# Patient Record
Sex: Male | Born: 1970 | Race: Black or African American | Hispanic: No | Marital: Married | Smoking: Never smoker
Health system: Southern US, Community
[De-identification: ages and names within clinical notes are randomized; demographics above are authoritative.]

## PROBLEM LIST (undated history)

## (undated) DIAGNOSIS — I713 Abdominal aortic aneurysm, ruptured, unspecified: Secondary | ICD-10-CM

## (undated) DIAGNOSIS — N186 End stage renal disease: Secondary | ICD-10-CM

## (undated) DIAGNOSIS — I10 Essential (primary) hypertension: Secondary | ICD-10-CM

## (undated) DIAGNOSIS — Z992 Dependence on renal dialysis: Secondary | ICD-10-CM

## (undated) DIAGNOSIS — N289 Disorder of kidney and ureter, unspecified: Secondary | ICD-10-CM

## (undated) HISTORY — PX: AV FISTULA PLACEMENT: SHX1204

## (undated) HISTORY — PX: CARDIAC SURGERY: SHX584

## (undated) HISTORY — PX: DIALYSIS FISTULA CREATION: SHX611

---

## 2005-01-04 ENCOUNTER — Inpatient Hospital Stay (HOSPITAL_COMMUNITY): Admission: EM | Admit: 2005-01-04 | Discharge: 2005-01-11 | Payer: Self-pay | Admitting: Emergency Medicine

## 2005-01-05 ENCOUNTER — Encounter (INDEPENDENT_AMBULATORY_CARE_PROVIDER_SITE_OTHER): Payer: Self-pay | Admitting: *Deleted

## 2005-07-17 HISTORY — PX: CARDIAC SURGERY: SHX584

## 2006-04-21 ENCOUNTER — Inpatient Hospital Stay (HOSPITAL_COMMUNITY): Admission: EM | Admit: 2006-04-21 | Discharge: 2006-05-05 | Payer: Self-pay | Admitting: Emergency Medicine

## 2006-04-22 ENCOUNTER — Encounter: Payer: Self-pay | Admitting: Vascular Surgery

## 2006-04-25 ENCOUNTER — Encounter (INDEPENDENT_AMBULATORY_CARE_PROVIDER_SITE_OTHER): Payer: Self-pay | Admitting: Interventional Cardiology

## 2006-04-27 ENCOUNTER — Encounter (INDEPENDENT_AMBULATORY_CARE_PROVIDER_SITE_OTHER): Payer: Self-pay | Admitting: *Deleted

## 2006-06-05 ENCOUNTER — Encounter (HOSPITAL_COMMUNITY): Admission: RE | Admit: 2006-06-05 | Discharge: 2006-08-16 | Payer: Self-pay | Admitting: Interventional Cardiology

## 2006-07-24 ENCOUNTER — Ambulatory Visit (HOSPITAL_COMMUNITY): Admission: RE | Admit: 2006-07-24 | Discharge: 2006-07-24 | Payer: Self-pay | Admitting: Nephrology

## 2006-09-20 ENCOUNTER — Ambulatory Visit (HOSPITAL_COMMUNITY): Admission: RE | Admit: 2006-09-20 | Discharge: 2006-09-20 | Payer: Self-pay | Admitting: Nephrology

## 2010-01-03 ENCOUNTER — Ambulatory Visit: Payer: Self-pay | Admitting: Surgery

## 2010-01-20 ENCOUNTER — Ambulatory Visit (HOSPITAL_COMMUNITY): Admission: RE | Admit: 2010-01-20 | Discharge: 2010-01-20 | Payer: Self-pay | Admitting: Surgery

## 2010-01-20 ENCOUNTER — Ambulatory Visit: Payer: Self-pay | Admitting: Surgery

## 2010-05-23 ENCOUNTER — Inpatient Hospital Stay (HOSPITAL_COMMUNITY)
Admission: AD | Admit: 2010-05-23 | Discharge: 2010-05-28 | Payer: Self-pay | Source: Home / Self Care | Admitting: Nephrology

## 2010-05-24 ENCOUNTER — Encounter (INDEPENDENT_AMBULATORY_CARE_PROVIDER_SITE_OTHER): Payer: Self-pay | Admitting: Nephrology

## 2010-05-26 ENCOUNTER — Ambulatory Visit: Payer: Self-pay | Admitting: Infectious Disease

## 2010-05-27 ENCOUNTER — Encounter (INDEPENDENT_AMBULATORY_CARE_PROVIDER_SITE_OTHER): Payer: Self-pay | Admitting: Interventional Cardiology

## 2010-07-05 ENCOUNTER — Ambulatory Visit: Payer: Self-pay | Admitting: Vascular Surgery

## 2010-07-05 ENCOUNTER — Ambulatory Visit: Payer: Self-pay | Admitting: Infectious Disease

## 2010-08-05 ENCOUNTER — Ambulatory Visit: Admit: 2010-08-05 | Payer: Self-pay

## 2010-08-07 ENCOUNTER — Encounter: Payer: Self-pay | Admitting: Thoracic Surgery (Cardiothoracic Vascular Surgery)

## 2010-08-07 ENCOUNTER — Encounter: Payer: Self-pay | Admitting: Nephrology

## 2010-08-22 ENCOUNTER — Ambulatory Visit (INDEPENDENT_AMBULATORY_CARE_PROVIDER_SITE_OTHER): Payer: Medicare Other | Admitting: Surgery

## 2010-08-22 DIAGNOSIS — N186 End stage renal disease: Secondary | ICD-10-CM

## 2010-08-22 DIAGNOSIS — T82898A Other specified complication of vascular prosthetic devices, implants and grafts, initial encounter: Secondary | ICD-10-CM

## 2010-08-26 NOTE — Assessment & Plan Note (Unsigned)
OFFICE VISIT  [name], [name] DOB:  04/24/1971                                       08/22/2010 CHART#:[id number]  The patient comes back in today for followup.  I met him in June of this year when I was asked to evaluate an aneurysm on his left arm fistula. He is on dialysis secondary to renal failure following an acute aortic dissection which has been repaired but left him with renal failure.  I studied him, a year ago he had 2 large aneurysms and an ectatic vein with some stenosis around the shoulder.  Since there had not been significant change in the size of his aneurysm and there were no ulcerations we elected to observe this.  He is sent back today by Dr. Darrick Penna for repeat evaluation because it is enlarging.  PHYSICAL EXAM:  Vital signs:  Heart rate is 94, blood pressure 152/91, respiratory rate 20.  General:  He is well appearing, in no distress. Respirations nonlabored.  Cardiovascular:  Large aneurysm with a shiny anterior appearance.  No ulceration.  Excellent thrill.  Skin:  No ulcers.  No rash.  ASSESSMENT:  Enlarging left arteriovenous fistula aneurysm.  PLAN:  Because of the appearance of the aneurysm as well as the fact that it may have gotten slightly larger I feel like at this point we need to have this repaired.  What I have discussed with the patient would be an incision proximal and distal to the aneurysm with an interposition Gore-Tex graft.  This would leave one further proximal aneurysm which is near one of his buttonholes that he access for, however, I think leaving this would be safe, just dealing with the larger one would be the best course of action at this time.  We did discuss my concerns about infection, as well as the residual defect once this aneurysm is decompressed.  We have scheduled this for Thursday, February 16.    Jorge Ny, MD  VWB/MEDQ  D:  08/22/2010  T:  08/23/2010  Job:  [id number]  cc:   Dr  Darrick Penna

## 2010-09-06 ENCOUNTER — Ambulatory Visit (HOSPITAL_COMMUNITY)
Admission: RE | Admit: 2010-09-06 | Discharge: 2010-09-06 | Disposition: A | Discharge status: Home / Self Care | Payer: Medicare Other | Source: Ambulatory Visit | Attending: Surgery | Admitting: Surgery

## 2010-09-06 DIAGNOSIS — T82898A Other specified complication of vascular prosthetic devices, implants and grafts, initial encounter: Secondary | ICD-10-CM | POA: Insufficient documentation

## 2010-09-06 DIAGNOSIS — Z992 Dependence on renal dialysis: Secondary | ICD-10-CM | POA: Insufficient documentation

## 2010-09-06 DIAGNOSIS — N186 End stage renal disease: Secondary | ICD-10-CM

## 2010-09-06 DIAGNOSIS — I120 Hypertensive chronic kidney disease with stage 5 chronic kidney disease or end stage renal disease: Secondary | ICD-10-CM | POA: Insufficient documentation

## 2010-09-06 DIAGNOSIS — Y838 Other surgical procedures as the cause of abnormal reaction of the patient, or of later complication, without mention of misadventure at the time of the procedure: Secondary | ICD-10-CM | POA: Insufficient documentation

## 2010-09-06 LAB — POCT I-STAT 4, (NA,K, GLUC, HGB,HCT)
Glucose, Bld: 106 mg/dL — ABNORMAL HIGH (ref 70–99)
Potassium: 5 mEq/L (ref 3.5–5.1)
Sodium: 140 mEq/L (ref 135–145)

## 2010-09-06 LAB — SURGICAL PCR SCREEN: Staphylococcus aureus: NEGATIVE

## 2010-09-10 NOTE — Op Note (Addendum)
[name], [name]                ACCOUNT NO.:  [id number]  MEDICAL RECORD NO.:  [id number]           PATIENT TYPE:  O  LOCATION:  SDSC                         FACILITY:  [facility]  PHYSICIAN:  Juleen China IV, MDDATE OF BIRTH:  02-08-1971  DATE OF PROCEDURE:  09/06/2010 DATE OF DISCHARGE:  09/06/2010                              OPERATIVE REPORT   PREOPERATIVE DIAGNOSIS:  Left arteriovenous fistula aneurysm.  POSTOPERATIVE DIAGNOSIS:  Left arteriovenous fistula aneurysm.  PROCEDURES PERFORMED: 1. Resection of left arteriovenous fistula aneurysm. 2. Interposition 8-mm Gore-Tex graft.  TYPE OF ANESTHESIA:  General.  SURGEON: 1. Charlena Cross, MD  ASSISTANT:  Della Goo, PA-C  FINDINGS:  Large aneurysm with thrombus present which was sent as a specimen.  An end-to-end anastomosis was performed with 8-mm Gore-Tex graft which was undersized.  I resected the entire aneurysm including the associated skin and performed primary closure of the resected skin area.  INDICATION:  [name] is a 40 year old gentleman who dialyzes through a left upper arm fistula.  He has developed a large approximately 5 cm aneurysm on his fistula.  The skin on top has become very thin.  There is concern for rupture and it has been growing.  He comes in today for a resection.  He had previously been evaluated with a fistulogram which showed no evidence of central stenosis.  PROCEDURE:  The patient was identified in the holding and taken to room 12, he was placed supine on the table.  General anesthesia was administered.  The patient was prepped and draped in the usual fashion. Time-out was called.  Antibiotics were given.  I initially made a transverse incision proximal and distal to the aneurysm.  Through these two incisions, the cephalic vein fistula was circumferentially dissected free and fully mobilized.  Once adequate exposure of the cephalic vein proximal and distal to the aneurysm  was obtained, I created a tunnel which went lateral to the aneurysm with a Kelly clamp.  After tunnel was created, the patient was given systemic heparinization.  I occluded the fistula at the antecubital crease and then transected this portion of the vein.  The defunctionalized limb was oversewn with two layers of 5-0 Prolene.  Prior to closing the aneurysmal side of the vein, I expressed out chronic thrombus which decompressed the aneurysm.  Next, I selected an 8-mm Gore-Tex graft and performed end-to-end anastomosis with running 5-0 Prolene.  Once this was completed, the proximal clamp was released and there was good flow through the graft.  The graft was then brought through the previously created tunnel.  Here, I occluded the fistula proximally and distally.  The defunctionalized side was closed with a 5- 0 Prolene in two layers.  I then performed anastomosis of the graft to the more proximal cephalic vein.  Again, this was an end-to-end anastomosis with running 5-0 Prolene.  Prior to completion of the anastomosis, the appropriate flush maneuvers were performed.  The anastomosis was completed.  There was restoration of good flow through the fistula.  Due to the large redundant skin that was left from this aneurysm, I made an  ellipse around the aneurysm with #10 blade and resected the redundant skin as well as the anterior two-thirds of the aneurysm.  The skin resected was for a length of 6 cm.  There were no branches in this area.  There was no backbleeding.  I then used a 2-0 Vicryl to oversew the residual portion of the aneurysmal sac and the subcutaneous tissue was then closed with interrupted 3-0 Vicryl and the skin was closed with 4-0 Vicryl.  The two transverse incisions that were proximal and distal to the aneurysm were then closed with two layers of 3-0 Vicryl.  Dermabond was placed in the wound.  The patient had a good thrill through his fistula, and he had palpable radial  pulse.  He was then successfully extubated and taken to recovery room in stable condition.     Jorge Ny, MD     VWB/MEDQ  D:  09/06/2010  T:  09/07/2010  Job:  [id number]  Electronically Signed by Arelia Longest IV MD on 09/10/2010 05:31:04 PM

## 2010-09-19 ENCOUNTER — Ambulatory Visit (INDEPENDENT_AMBULATORY_CARE_PROVIDER_SITE_OTHER): Payer: Medicare Other | Admitting: Surgery

## 2010-09-19 DIAGNOSIS — N186 End stage renal disease: Secondary | ICD-10-CM

## 2010-09-20 NOTE — Assessment & Plan Note (Signed)
OFFICE VISIT  Contreras, Billy DOB:  09-17-1970                                       09/19/2010 CHART#:[id number]  The patient comes back in today for follow-up.  He underwent resection of a large aneurysm of his left upper arm fistula, 09/06/2010  after placement of an interposition 8-mm Gore-Tex graft.  He comes back in today.  His incisions are well-healed.  His arm was dramatically better. There is a good thrill up in his fistula.  I think this can be accessed within 2 weeks.  He will contact me if any complications arise.    Jorge Ny, MD Electronically Signed  VWB/MEDQ  D:  09/19/2010  T:  09/20/2010  Job:  [id number]

## 2010-09-27 LAB — IRON AND TIBC
Iron: 15 ug/dL — ABNORMAL LOW (ref 42–135)
Saturation Ratios: 9 % — ABNORMAL LOW (ref 20–55)
TIBC: 171 ug/dL — ABNORMAL LOW (ref 215–435)
UIBC: 156 ug/dL

## 2010-09-27 LAB — CBC
HCT: 28.3 % — ABNORMAL LOW (ref 39.0–52.0)
Hemoglobin: 9.1 g/dL — ABNORMAL LOW (ref 13.0–17.0)
Hemoglobin: 9.7 g/dL — ABNORMAL LOW (ref 13.0–17.0)
MCH: 27.2 pg (ref 26.0–34.0)
MCH: 27.5 pg (ref 26.0–34.0)
MCHC: 34.3 g/dL (ref 30.0–36.0)
MCV: 79.5 fL (ref 78.0–100.0)
Platelets: 513 10*3/uL — ABNORMAL HIGH (ref 150–400)
RBC: 3.31 MIL/uL — ABNORMAL LOW (ref 4.22–5.81)
RBC: 3.56 MIL/uL — ABNORMAL LOW (ref 4.22–5.81)
RBC: 3.84 MIL/uL — ABNORMAL LOW (ref 4.22–5.81)
WBC: 16.8 10*3/uL — ABNORMAL HIGH (ref 4.0–10.5)

## 2010-09-27 LAB — CULTURE, BLOOD (ROUTINE X 2)
Culture  Setup Time: 201111072322
Culture  Setup Time: 201111091915
Culture: NO GROWTH

## 2010-09-27 LAB — COMPREHENSIVE METABOLIC PANEL
Albumin: 3.1 g/dL — ABNORMAL LOW (ref 3.5–5.2)
Alkaline Phosphatase: 55 U/L (ref 39–117)
BUN: 70 mg/dL — ABNORMAL HIGH (ref 6–23)
BUN: 89 mg/dL — ABNORMAL HIGH (ref 6–23)
CO2: 23 mEq/L (ref 19–32)
Calcium: 9.1 mg/dL (ref 8.4–10.5)
Chloride: 97 mEq/L (ref 96–112)
Creatinine, Ser: 17.02 mg/dL — ABNORMAL HIGH (ref 0.4–1.5)
GFR calc Af Amer: 4 mL/min — ABNORMAL LOW (ref >60)
GFR calc non Af Amer: 3 mL/min — ABNORMAL LOW (ref >60)
Glucose, Bld: 105 mg/dL — ABNORMAL HIGH (ref 70–99)
Potassium: 4.6 mEq/L (ref 3.5–5.1)
Sodium: 136 mEq/L (ref 135–145)
Total Bilirubin: 0.4 mg/dL (ref 0.3–1.2)
Total Protein: 8.4 g/dL — ABNORMAL HIGH (ref 6.0–8.3)

## 2010-09-27 LAB — DIFFERENTIAL
Basophils Absolute: 0 10*3/uL (ref 0.0–0.1)
Eosinophils Relative: 0 % (ref 0–5)
Lymphocytes Relative: 7 % — ABNORMAL LOW (ref 12–46)
Monocytes Relative: 9 % (ref 3–12)
Neutrophils Relative %: 84 % — ABNORMAL HIGH (ref 43–77)

## 2010-09-27 LAB — HIV ANTIBODY (ROUTINE TESTING W REFLEX): HIV: NONREACTIVE

## 2010-09-27 LAB — GLUCOSE, CAPILLARY
Glucose-Capillary: 108 mg/dL — ABNORMAL HIGH (ref 70–99)
Glucose-Capillary: 111 mg/dL — ABNORMAL HIGH (ref 70–99)
Glucose-Capillary: 113 mg/dL — ABNORMAL HIGH (ref 70–99)
Glucose-Capillary: 129 mg/dL — ABNORMAL HIGH (ref 70–99)
Glucose-Capillary: 132 mg/dL — ABNORMAL HIGH (ref 70–99)
Glucose-Capillary: 132 mg/dL — ABNORMAL HIGH (ref 70–99)
Glucose-Capillary: 153 mg/dL — ABNORMAL HIGH (ref 70–99)

## 2010-09-27 LAB — RENAL FUNCTION PANEL
Albumin: 3.1 g/dL — ABNORMAL LOW (ref 3.5–5.2)
BUN: 87 mg/dL — ABNORMAL HIGH (ref 6–23)
CO2: 21 mEq/L (ref 19–32)
CO2: 22 mEq/L (ref 19–32)
Chloride: 90 mEq/L — ABNORMAL LOW (ref 96–112)
Chloride: 91 mEq/L — ABNORMAL LOW (ref 96–112)
Creatinine, Ser: 16.13 mg/dL — ABNORMAL HIGH (ref 0.4–1.5)
GFR calc Af Amer: 4 mL/min — ABNORMAL LOW (ref >60)
GFR calc non Af Amer: 3 mL/min — ABNORMAL LOW (ref >60)
Glucose, Bld: 111 mg/dL — ABNORMAL HIGH (ref 70–99)
Sodium: 133 mEq/L — ABNORMAL LOW (ref 135–145)

## 2010-09-27 LAB — FERRITIN: Ferritin: 887 ng/mL — ABNORMAL HIGH (ref 22–322)

## 2010-10-02 LAB — POCT I-STAT, CHEM 8
Calcium, Ion: 1.05 mmol/L — ABNORMAL LOW (ref 1.12–1.32)
HCT: 42 % (ref 39.0–52.0)
Hemoglobin: 14.3 g/dL (ref 13.0–17.0)
Sodium: 134 mEq/L — ABNORMAL LOW (ref 135–145)
TCO2: 30 mmol/L (ref 0–100)

## 2010-11-29 NOTE — Assessment & Plan Note (Signed)
OFFICE VISIT   [name], [name]  DOB:  [birth year]                                       01/03/2010  CHART#:[id number]   REASON FOR VISIT:  Aneurysm, left arm dialysis graft.   HISTORY:  This is a 40 year old gentleman with end-stage renal disease.  He started dialysis in 2007 after renal failure following aortic  dissection.  He underwent sternotomy and aortic dissection repair but  had malperfusion to his kidneys requiring dialysis.  He dialyzes at  home.  He had developed an aneurysm on his left arm fistula  approximately 2 years ago.  It has not gotten any bigger over the past  several years. He has not had any ulcers on it.  He does have a history  of having undergone balloon venoplasty 6 months after his fistula was  placed.   The patient also suffers from hypertension which is managed medically.   REVIEW OF SYSTEMS:  Negative for chest pain, negative for shortness of  breath.  All other review of systems are negative.   PAST MEDICAL HISTORY:  Hypertension, end-stage renal disease, aortic  dissection.   PAST SURGICAL HISTORY:  Ascending aortic dissection repair and left  upper arm fistula.   SOCIAL HISTORY:  He is married with 3 children.  He works at [employer].  He does not smoke and does not drink.   FAMILY HISTORY:  Negative for cardiovascular disease.   ALLERGIES:  None.   MEDICATIONS:  PhosLo, Hectorol, aspirin, Epogen, Dialyvite.   PHYSICAL EXAMINATION:  Heart rate 102, blood pressure 159/107,  temperature is 98.0.  General:  Well-appearing, no distress.  HEENT:  Within normal limits.  Lungs:  Clear bilaterally.  Cardiovascular:  Regular rate and rhythm.  No carotid bruits.  Abdomen:  Soft, nontender.  No pulsatile mass.  Musculoskeletal:  Without major deformity.  Neuro:  He has no focal deficits.  Skin:  Without rash.  Left arm:  The patient  has aneurysmal changes to his fistula near the mid to distal upper arm.  There is no skin breakdown.   PLAN:  Left arm fistula aneurysm.  I think the best course of action is  to proceed with a fistulogram to see if there is any evidence of venous  stenosis.  The patient does have prominent collateral veins in his  forearm, and he most likely has some form of central stenosis.  I would  also like to get a fistulogram to define his anatomy.  If this does come  to resection, I would like to know where I can plan my proximal and  distal anastomosis so as to preserve his button hole sites.  I have  scheduled his fistulogram for Thursday, July 7th.  We will proceed with  venoplasty if need be.     Jorge Ny, MD  Electronically Signed   VWB/MEDQ  D:  01/03/2010  T:  01/04/2010  Job:  [id number]

## 2010-12-02 NOTE — Consult Note (Signed)
NAMEYORDIN, RHODA                ACCOUNT NO.:  [id number]   MEDICAL RECORD NO.:  [id number]          PATIENT TYPE:  INP   LOCATION:  6731                         FACILITY:  [facility]   PHYSICIAN:  Di Kindle. Edilia Bo, M.D.DATE OF BIRTH:  12-05-1970   DATE OF CONSULTATION:  04/22/2006  DATE OF DISCHARGE:                                   CONSULTATION   REASON FOR CONSULTATION:  Need for hemodialysis access.   HISTORY:  This is a pleasant 40 year old gentleman with chronic renal  insufficiency secondary to hypertension.  He is right-handed.  He was  admitted on 04/21/2006 and vascular surgery was consulted for placement of  temporary dialysis catheter and also to evaluate him for long-term access.   PAST MEDICAL HISTORY:  Otherwise significant for hypertension and a history  of hypokalemia in the past.  He has no history of diabetes, history of  previous MI or history of congestive heart failure.   REVIEW OF SYSTEMS:  He has had no recent chest pain, chest pressure,  palpitations or arrhythmias.  He has had no bronchitis, asthma or wheezing.   PHYSICAL EXAMINATION:  Temperature is 97.7, blood pressure 198/124.  Currently pressure is down to 181/102.  Lungs are clear bilaterally to auscultation.  He has a palpable brachial and  radial pulse bilaterally.  I did not see a usable forearm cephalic vein on  either side.   RECOMMENDATIONS:  I have recommend that we try to obtain a vein map of the  left arm and then schedule him for a place of a Diatek catheter and left AV  fistula or AV graft tomorrow by Dr. Madilyn Fireman.  I have discussed the indications  for the procedures and potential complications including not limited to  bleeding, pneumothorax, catheter infection, with respect to the fistula  graft.  He understands the risks or failure of the fistula to mature, graft  thrombosis, graft infection, steal syndrome.  All of his questions were  answered and he is agreeable to  proceed.      Di Kindle. Edilia Bo, M.D.  Electronically Signed     CSD/MEDQ  D:  04/22/2006  T:  04/23/2006  Job:  [id number]

## 2010-12-02 NOTE — Consult Note (Signed)
NAMEEMERIC, NOVINGER                ACCOUNT NO.:  [id number]   MEDICAL RECORD NO.:  [id number]          PATIENT TYPE:  INP   LOCATION:  0103                         FACILITY:  [facility]   PHYSICIAN:  Wilber Bihari. Caryn Section, M.D.   DATE OF BIRTH:  1971-03-23   DATE OF CONSULTATION:  01/04/2005  DATE OF DISCHARGE:                                   CONSULTATION   Mr. Copelan is a 40 year old previously healthy black man admitted with  blurred vision, marked elevation in blood pressure, BUN 31, creatinine 4.3  and potassium 2.3.  Dr. Jomarie Longs requested renal consultation.  Mr. Rodriquez was  told of borderline hypertension about five years ago but had exam two  years ago, 130/80.  No blood pressure has been checked since that time.  He went to the eye doctor today for blurred vision and headache, and he was  found to have papilledema, marked hypertensive retinopathy.  Blood pressure  was taken, and he was told to come to the ER.   PAST MEDICAL HISTORY:  No surgery.  He does not have a family doctor.  No  prior hospitalizations.   REVIEW OF SYSTEMS:  He has noted foamy urine 2-3 months.  No rash.  No  arthritis.  No gout.  No alopecia.  No oral ulcers.  No hesitancy.  No  urgency.  No decreased force of his urinary stream.  No renal colic.  No  gross hematuria.  No hemoptysis.  No angina.  No claudication.  No loss of  consciousness.  No palpitations.   SOCIAL HISTORY:  He lives with his wife.  They have been married x4 months.  He was born in [place].  He graduated from [employer] in [place] with a degree in Consulting civil engineer.  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  He does  not smoke cigarettes, drink alcohol, or take illegal drugs.   FAMILY HISTORY:  Father is age 42.  Mother is age 92.  He has one older  brother and one younger sister.  All are well.  No renal disease in the  family.  No one in the family on dialysis.  He has  three children by a  previous marriage, 70, 53, and 42 years old.  The 42 year old lives with him.  The 45-year-old and 70-year-old live with their mother.   PHYSICAL EXAMINATION:  VITAL SIGNS:  Blood pressure now 200/120, pulse 84,  respirations 20, blood pressure 236/156 earlier.  GENERAL:  He is an awake, alert, and courteous man.  HEENT:  Eyes show papilledema, hemorrhages, and exudates.  Nose, mouth, and  pharynx reveal moist mucous membranes.  No oral ulcers.  No abscesses.  No  inflammation.  NECK:  Carotids 2+ bilaterally without bruits.  LUNGS:  Clear.  HEART:  Regular rate and rhythm.  No rubs heard.  ABDOMEN:  Nontender.  No organs or masses are felt.  Bowel sounds are  present.  No bruits are heard.  GU:  Circumcised penis.  Testes are descended bilaterally without masses.  EXTREMITIES:  No rash.  No arthritis.  No atheroembolic changes.  NEUROLOGIC:  Right-handed.  Strength equal.  Sensation is intact.   Urinalysis shows 3+ protein, 3+ blood, 1-2 RBCs, no WBCs, no casts.   Renal ultrasound shows right kidney 10.7 cm, left kidney 10.9 cm, slight  increase in echogenicity.  No hydronephrosis.   Hemoglobin 13.8, WBC 6,100, PLTS 157K, sodium 139, potassium 2.3, chloride  93, CO2 33, BUN 31, creatinine 4.3, calcium 9.5, albumin 4.5.   IMPRESSION:  1.  Hypertension with hypokalemia.  2.  Abnormal renal function with 3+ proteinuria and bland urine sediment.   PLAN:  1.  Continue with slow decrease in blood pressure over the next 48 hours.  I      do not think he needs evaluation for primary hyperaldosteronuria unless      potassium is very hard to replace (potassium quite likely depressed from      hyperuremic state of hypertensive crisis.  Would not use ACE inhibitor      currently.  2.  Check HIV.  Check PT and Ivy's bleeding time, in case renal biopsy is      needed.  No IV's and no needle sticks in left forearm (save for vascular      access).  Will follow.        RFF/MEDQ  D:  01/04/2005  T:  01/04/2005  Job:  [id number]

## 2010-12-02 NOTE — Op Note (Signed)
[name], [name]                ACCOUNT NO.:  [id number]   MEDICAL RECORD NO.:  [id number]          PATIENT TYPE:  INP   LOCATION:  6731                         FACILITY:  [facility]   PHYSICIAN:  Balinda Quails, M.D.    DATE OF BIRTH:  10/23/1970   DATE OF PROCEDURE:  04/23/2006  DATE OF DISCHARGE:                                 OPERATIVE REPORT   SURGEON:  Denman George, MD.   ASSISTANT:  Charlesetta Garibaldi, PA   ANESTHESIA:  Local with MAC.   ANESTHESIOLOGIST:  Smith.   PREOPERATIVE DIAGNOSIS:  End-stage renal failure.   POSTOPERATIVE DIAGNOSIS:  End-stage renal failure.   PROCEDURE:  1. Left brachiocephalic arteriovenous fistula.  2. Ultrasound-guided right internal jugular Diatek catheter.   OPERATIVE PROCEDURE:  The patient brought to the operating room stable  hemodynamic condition.  Placed in supine position.  Left arm prepped and  draped in sterile fashion.   Skin, subcutaneous tissues instilled 1% Xylocaine left wrist.  Longitudinal  skin incision made through the anatomical snuff box.  Dissection carried  down to expose the very small cephalic vein.  This was less than 2 mm in  size.  Felt to be an inadequate for Cimino arteriovenous fistula.  The  subcutaneous tissue was closed running 3-0 Vicryl sutures.  Skin closed 4-0  Monocryl.   The second skin incision then made left antecubital fossa.  Dissection  carried through the subcutaneous tissue electrocautery.  The left cephalic  vein identified.  This was 2.5 to 3 mm in size.  Was mobilized.  Tributaries  ligated 4-0 silk and divided.  The vein ligated distally with clips.  Divided and dilated up easily to 2.5 mm.  Heparin saline flushed and  controlled with a bulldog clamp.  The patient administered 3000 units  heparin intravenously.   The brachial artery then was freed.  Encircled proximally distally with  vessel loops.  Controlled with bulldog clamps.  Longitudinal arteriotomy  made.  The vein anastomosed  end-to-side to the brachial artery using running  7-0 Prolene suture.  Clamps were removed.  Excellent flow present.  Adequate  hemostasis obtained.  Sponge instrument counts correct.   The subcutaneous tissue closed running 3-0 Vicryl suture.  Skin closed with  4-0 Monocryl.  Steri-Strips applied.  Sterile dressings applied to the arm.   Ultrasound of the right neck then carried out.  The right internal jugular  vein appeared to be widely patent with normal compressibility.  The right  neck and chest prepped and draped in sterile fashion.   Skin and subcutaneous tissues instilled 1% Xylocaine.  A needle easily  introduced right internal jugular vein.  0.035 J-wire passed through the  needle into the superior vena cava under fluoroscopy.  The site opened 11  blade.  The 12, 14, and 16 dilators advanced over guidewire.  16 dilator  tearaway sheath advanced over the guidewire.  The dilator and guidewire  removed.  Catheter placed through the sheath into the superior vena cava.  The tearaway sheath removed.  Subcutaneous tunnel created, catheter brought  through the tunnel.  Catheter divided and hub mechanism assembled.   Insertion site closed with interrupted 3-0 nylon suture.  Catheter fixed  skin interrupted 2-0 silk suture.  Sterile dressings applied.  The patient  transferred to recovery in stable condition.      Balinda Quails, M.D.  Electronically Signed     PGH/MEDQ  D:  04/23/2006  T:  04/24/2006  Job:  [id number]

## 2010-12-02 NOTE — Consult Note (Signed)
NAMEAVIEN, [name]                ACCOUNT NO.:  [id number]   MEDICAL RECORD NO.:  [id number]          PATIENT TYPE:  INP   LOCATION:  2305                         FACILITY:  [facility]   PHYSICIAN:  Salvatore Decent. Cornelius Moras, M.D. DATE OF BIRTH:  1971-03-20   DATE OF CONSULTATION:  04/25/2006  DATE OF DISCHARGE:                                   CONSULTATION   REQUESTING PHYSICIAN:  Dr. Verdis Prime.   PRIMARY CARE PHYSICIAN:  Dr. Delano Metz.   REASON FOR CONSULTATION:  Aortic dissection.   HISTORY OF PRESENT ILLNESS:  [name] is a 40 year old African American  male with history of longstanding hypertension and chronic renal failure.  He was hospitalized in June2006 with hypertensive crisis and retinal  hemorrhages.  At that time his creatinine was 4.0.  MRA of the aorta  performed at that time was unremarkable.  Over the last year.  The patient  has not kept follow-up appointments with the [facility].  He  recently developed shortness of breath, lower extremity edema, fatigue and  orthopnea, ultimately prompting him to present to Urgent Care at [facility] on [place].  He was diagnosed with congestive heart failure,  hypertension and renal failure.  His creatinine was 16.8 with a BUN of 163.  He returned on October6 at which time his creatinine was up further to 19.8  with a BUN of 211.  He was subsequently hospitalized and started on  hemodialysis.  He underwent placement of a right IJ dialysis catheter and  left forearm primary AV fistula on October8 by Dr. Madilyn Fireman.  This procedure  was uncomplicated.  Cardiology consultation was requested due to the  development of paroxysmal atrial flutter and malignant hypertension.  The  patient underwent a 2-D echocardiogram this afternoon which revealed severe  left ventricular hypertrophy with mild left ventricular systolic  dysfunction, ejection fraction estimated at 50%.  There was mild to moderate  aortic insufficiency with  mild dilatation of the aortic root and the  ascending thoracic aorta.  An aortic dissection involving the descending  thoracic aorta was noted and this prompted cardiothoracic surgery  consultation.  A CT angiogram of the aorta was ordered and has been obtained  this evening.  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  By definition this now represents type A aortic  dissection due to retrograde involvement of the ascending aorta from what  started in the descending aorta.  There is a tiny amount of pericardial  fluid that is of serous fluid density on CT scan and not consistent with  hematoma.  There is a small left pleural effusion that also appears to be  serous and not consistent with hematoma.  The left kidney is perfused off  the false lumen where the right kidney and all  the visceral vessels are  perfused off the true lumen of the descending  thoracic aorta.   REVIEW OF SYSTEMS:  The patient specifically denies any symptoms of chest  pain, back pain, neck pain, jaw pain at all, either presently or in recent  history.  The patient has had some exertional shortness of breath, but  reports really having more symptoms related to fatigue.  The patient states  he had some lower extremity edema.  At presentation, but this has improved.  He has been eating well and his appetite has been stable.  He denies  abdominal pain.  He denies back pain or leg pain.  He denies headaches or  visual disturbances.  He denies any transient neurologic symptoms.  The  remainder of his review of systems is unremarkable.   PAST MEDICAL HISTORY:  1. Malignant hypertension.  2. Renal failure.   PAST SURGICAL HISTORY:  None.   MEDICATIONS PRIOR  TO ADMISSION:  Labetalol and Lasix.   CURRENT MEDICATIONS:  Amlodipine, metoprolol, Aranesp, iron, and clonidine  as necessary for severe hypertension.   DRUG ALLERGIES:  None known.   PHYSICAL EXAM:  GENERAL: The patient is a well-appearing African American  male who appears his stated age and in no acute distress.  VITAL SIGNS:  Blood pressure is currently approximately 170/100.  He is in  sinus rhythm.  HEENT:  Exam is grossly unremarkable.  Neck is supple.  There are no carotid  bruits.  There is no jugular venous distension.  Auscultation of the chest  demonstrates clear breath sounds bilaterally.  No wheezes or rhonchi noted.  CARDIOVASCULAR:  Exam notable for regular rate and rhythm.  No murmurs, rubs  or gallops are noted.  ABDOMEN: Flat, soft, nontender.  There are no palpable masses.  Bowel sounds  present.  Pulses are palpable in both groins.  EXTREMITIES:  Small surgical incision of the left wrist for recent primary  AV fistula.  There is no surrounding hematoma.  There is no lower extremity  edema.  The remainder of his  physical exam is noncontributory.   DIAGNOSTIC TEST:  2-D echocardiogram and CT angiogram of the thoracic aorta  performed today have both been reviewed.  Findings are as discussed  previously.   IMPRESSION:  Probable original type B aortic dissection with retrograde  extension of intramural hematoma to involve the ascending aorta and  transverse aortic arch, which by definition means this now represents a type  A aortic dissection.  I suspect this happened quite some time in the past  and has stabilized, although it is impossible to know for certain how long  this has been there and MRA performed in 2006 clearly did not demonstrate  aortic dissection at that time.  Despite the fact that this may have  stabilized, I do not feel that we can presume the ascending aorta and transverse arch will remain stable and I suspect the patient should best be   treated as though this were an acute or subacute type A dissection.  Given  his stability, it would probably make sense to proceed with coronary  arteriography if the patient will consider surgical replacement of his  ascending aorta and transverse aortic arch.  At the very least he requires  aggressive attention to control his blood pressure.   PLAN:  I have discussed issues at length with [name] this evening.  He is  quite surprised by my comments and recommendations, and at this point he is  not willing to consider surgical intervention.  For that matter the patient  does  not wish to be transferred to the intensive care unit for monitoring  purposes at this juncture.  He wants some time to think things over and to  talk it over with his family.  Under the circumstances, this seems  reasonable, although again I remain concerned regarding the small but  probably real potential for acute decompensation and/or myocardial  infarction and/or stroke.  All of his questions have been addressed.  I have  discussed these findings over  the telephone with Dr. Briant Cedar from the nephrology service and we will  attempt to contact Dr. Katrinka Blazing from the cardiology service.  I have offered to  return early tomorrow morning to discuss matters further with [name] and  I have suggested that he needs to contact us family members and have them  present.      Salvatore Decent. Cornelius Moras, M.D.  Electronically Signed     CHO/MEDQ  D:  04/25/2006  T:  04/27/2006  Job:  [id number]   cc:   Lyn Records, M.D.  Maree Krabbe, M.D.

## 2010-12-02 NOTE — H&P (Signed)
[name], [name]                ACCOUNT NO.:  [id number]   MEDICAL RECORD NO.:  [id number]          PATIENT TYPE:  INP   LOCATION:  1825                         FACILITY:  [facility]   PHYSICIAN:  Maree Krabbe, M.D.DATE OF BIRTH:  October 09, 1970   DATE OF ADMISSION:  04/21/2006  DATE OF DISCHARGE:                                HISTORY & PHYSICAL   HISTORY:  The patient is a 40 year old African American male with a history  of hypertension and chronic kidney disease.  He was last seen here at [facility] in June 2006 when he presented with retinal hemorrhages and a  blood pressure of 300/150.  Blood pressure was controlled and he was  discharged.  His creatinine was 4.0 at the time when he had a renal workup  with echogenic kidneys normal size; no hydronephrosis.  Negative ANA.  Negative HIV.  Normal complement levels and normal serum catecholamines.  He  saw Dr. Caryn Section twice since then and the last time was in January 2007 at  [facility] but he has not kept any regular appointments with  any doctors since over the past year.  He sees Urgent Care as needed.  He  recently developed shortness of breath, lower extremity edema, and fatigue,  and orthopnea.  He went to Urgent Care at [facility] on April 19, 2006,  two days ago.  He was diagnosed with CHF, hypertension, renal failure.  He  was taking labetalol twice daily at the time and additionally was started on  Lasix, lisinopril, and potassium pills.  When his labs came back, a BUN was  163, creatinine 16.8, and the lisinopril was held and he was told to come  back for repeat labs today.  Today, the BUN came back at 211, creatinine  19.8.  He was told to go immediately to the emergency room for dialysis.  The patient did have some nausea and vomiting.  He was put on Lasix 40  b.i.d. and his shortness of breath has resolved.  His leg edema has not.  He  denies excessive use of over-the-counter NSAIDs.  He has had  significant  fatigue with exertion.  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  He has been married, I  believe it is his second marriage, time for 1 year.  He has 3 children, a 54-  year-old, 93-year-old, and 42 year old at home.  He does not smoke, do drugs,  or drink alcohol.  He does not have a regular physician.   PAST MEDICAL HISTORY:  1. Hypertension, malignant hypertension.  2. Renal failure.   PAST SURGICAL HISTORY:  Noncontributory.   MEDICATIONS:  1. Labetalol, uncertain dose b.i.d. which he has been taking for some      time.  2. Lasix 40 b.i.d. was added this week.   SOCIAL HISTORY:  As above.   REVIEW OF SYSTEMS:  GENERAL:  No fever, chills, sweats, or weight loss.  ENT:  No hearing loss, visual change, sore throat or difficulty swallowing.  CARDIORESPIRATORY:  As  above.  No hemoptysis.  No chest pain.  GI:  No  nausea, vomiting, or abdominal pain recently.  GU:  No difficulty voiding.  No dysuria, hematuria, or change in voiding habits.  MUSCULOSKELETAL:  No  myalgia or arthralgia.  Otherwise as above.  NEUROLOGIC:  No focal numbness  or weakness.  No history of stroke, TIA, or seizure.  He did have  papilledema with his admission for malignant hypertension.   PHYSICAL EXAMINATION:  VITAL SIGNS:  Blood pressure currently 170/100.  Heart rate 80.  Respirations 18.  Temperature 98.6.  GENERAL:  The patient is alert and oriented, comfortable, lying at 40  degrees in no distress, pleasant.  SKIN:  Without rash.  HEENT:  PERRL, EOMI.  Throat was clear.  NECK:  Supple without JVD.  CHEST:  Clear throughout.  No rales or wheezing.  CARDIAC:  Regular rate and rhythm without murmur, rub, or gallop.  ABDOMEN:  Soft, nontender.  The liver edge is 4 cm below the right costal  margin; nontender, nonpulsatile.  No bruits.  GU:  Deferred.  MUSCULOSKELETAL:  He has 3+ pitting edema from the knees to the feet   bilaterally. No joint effusion or deformity.  NEUROLOGIC:  Grossly nonfocal  motor exam.   LABS FROM URGENT CARE EARLIER THIS MORNING:  Sodium 130, potassium 3.6, BUN  211, creatinine 19.8, CO2 16, hemoglobin 11, white blood count 8000,  platelets 189, calcium 8, albumin 2.7.   IMPRESSION:  1. 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  Workup in June 2006 was essentially      negative.  Creatinine was 4 sixteen months ago and now is 19.  He has      had poor compliance and followup with doctors; I presume this is      primarily a financial issue but we did not go into this.  2. Hypertension.  3. A history of malignant hypertension June 2006.  4. Hypokalemia.  5. Volume excess with lower extremity edema.   PLAN:  Admit.  Lasix.  Start dialysis in the morning with a temporary  catheter.  Blood pressure control.  Check PTH, iron saturation, serum  phosphorus.  Consult CVTS for Diatek catheter and permanent access placement  earlier this week.      Maree Krabbe, M.D.  Electronically Signed     RDS/MEDQ  D:  04/21/2006  T:  04/23/2006  Job:  [id number]

## 2010-12-02 NOTE — Op Note (Signed)
[name], [name]                ACCOUNT NO.:  [id number]   MEDICAL RECORD NO.:  [id number]          PATIENT TYPE:  INP   LOCATION:  2305                         FACILITY:  [facility]   PHYSICIAN:  Salvatore Decent. Cornelius Moras, M.D. DATE OF BIRTH:  Oct 22, 1970   DATE OF PROCEDURE:  04/27/2006  DATE OF DISCHARGE:                                 OPERATIVE REPORT   PREOPERATIVE DIAGNOSIS:  Type A aortic dissection.   POSTOPERATIVE DIAGNOSIS:  Type A aortic dissection.   PROCEDURE:  Median sternotomy for replacement of the ascending thoracic  aorta and transverse aortic arch with elephant trunk distal anastomoses and  resuspension of the native aortic valve using profound hypothermia and total  circulatory arrest.   SURGEON:  Salvatore Decent. Cornelius Moras, M.D.   ASSISTANT:  Dr. Charlett Lango   ANESTHESIA:  General   CLINICAL NOTE:  The patient is a 40 year old African American male with  longstanding hypertension and end-stage renal disease who is now dialysis  dependent.  The patient presents with symptoms of renal failure and was  hospitalized and begun on hemodialysis.  Echocardiogram was performed  demonstrating severe left ventricular hypertrophy with mild dilatation of  the aortic root and what appeared to be an aortic dissection involving the  descending thoracic aorta.  CT angiogram of the thoracic aorta demonstrates  what appears to have developed initially as a type B aortic dissection with  retrograde progression to involve the aortic arch and the ascending aorta  with large intramural hematoma extending down into the aortic root.  There  is mild to moderate aortic insufficiency.  Cardiac CT scan was also  performed to define the coronary anatomy and the patient does not appear to  have any high-grade significant proximal coronary artery stenosis.   OPERATIVE CONSENT:  The patient has been counseled at length regarding the  indications and potential benefits of surgical intervention.   Alternative  treatment strategies have been discussed including possible conservative  treatment with aggressive blood pressure control and observation versus  proceeding with surgical repair at this juncture.  Due to the involvement of  the ascending aorta and transverse aortic arch with involvement of the  aortic valve and aortic insufficiency, the patient's condition is felt not  likely to remain stable despite the fact that the patient appears to have  presented with a subclinical event related to the aortic dissection and the  time course remains uncertain.  The patient and his family understand  increased risks associated with surgery and specifically accept all  associated risks including but not limited to risk of death, stroke,  myocardial infarction, congestive heart failure, respiratory failure,  pneumonia, bleeding requiring blood transfusion, arrhythmia, heart block  with bradycardia requiring permanent pacemaker, or late complications  related to progression of the aortic pathology involving the remaining  descending thoracic aorta.  They understand the possible need for aortic  valve replacement if resuspension of the native aortic valve is not  feasible.  They understand that the surgery itself will likely require at  least a brief period of time with profound hypothermia and deep and total  circulatory arrest due to likely need to replace the entire transverse  aortic arch.  All their questions have been addressed.   OPERATIVE FINDINGS:  1. Subacute aortic dissection with what clearly developed initially as a      type B aortic dissection and developed a retrograde progression to      involve the transverse aortic arch and the ascending aorta with a large      amount of intramural hematoma but without reentry intimal tear into the      true lumen of the ascending aorta.  2. Subacute pericarditis.  3. Severe hypertensive cardiomyopathy with severe left ventricular       hypertrophy.  4. No residual aortic insufficiency after successful surgical repair and      resuspension of the native aortic valve.   OPERATIVE NOTE:  The patient is brought to the operating room on the above-  mentioned date and placed in the supine position on the operating table.  Central monitoring was established by the anesthesia service under the care  and direction of Dr. Kipp Brood.  Specifically, a Swan-Ganz catheter is  placed through the left internal jugular approach.  A right radial arterial  line was placed.  Intravenous antibiotics were administered.  Following  induction with general endotracheal anesthesia, Foley catheter was placed.  The patient's chest, abdomen, both groins, and both lower extremities were  prepared, draped in sterile manner.   Baseline transesophageal echocardiogram was performed by Dr. Noreene Larsson.  This  demonstrates severe left ventricular hypertrophy.  The aortic dissection is  easily visualized with a large intramural hematoma in the wall of the  ascending thoracic aorta extending down into the aortic root to involve the  sinus of Valsalva adjacent to the noncoronary cusp and right coronary cusp  of the aortic valve.  There is mild aortic insufficiency.   A small right subclavian incision is made one fingerbreadth below the  clavicle across the right deltopectoral groove.  The pectoralis major muscle  fibers were divided longitudinally to expose the fascia in the deltopectoral  groove which is incised with electrocautery.  The right axillary artery was  now exposed using a self-retaining retractor.  The right axillary artery is  normal in appearance.   A median sternotomy incision was performed.  The pericardium is opened.  The  ascending aorta is acutely inflamed with what appears to be subacute  inflammation.  There is hemorrhagic pericardial effusion.  There is severe left ventricular hypertrophy with severe hypertensive cardiomyopathy with   very abnormal appearing gross appearance of the myocardium itself.  There is  visible atherosclerotic plaque within the coronary arteries.  The patient is  heparinized systemically.   A right femoral arterial line is placed using the Seldinger technique for  monitoring purposes.  The right axillary artery is now briefly occluded  between the DeBakey vascular clamps and an 8 mm Hemashield graft was sewn in  end-to-side fashion onto the axillary artery to serve as the arterial  cannula for cardiopulmonary bypass.  The Hemashield graft is now connected  directly to the arterial line from the cardiopulmonary bypass machine.  A  two-stage venous cannula is placed through the right atrium.  A retrograde  cardioplegic catheter was placed through the right atrium into the coronary  sinus.  Adequate heparinization is verified.  Cardiopulmonary bypass is  begun.  The left ventricular vent was placed through the right superior  pulmonary vein.  Systemic cooling is begun to cool the patient to  core  temperature less than 18 degrees centigrade.  Further dissection is  continued around the ascending aorta, the innominate vein, the innominate  artery, and the left carotid artery.  The aortic crossclamp was placed  across the ascending aorta and the heart was arrested using cold retrograde  blood cardioplegia.  Iced saline slush was applied for topical hypothermia.  The initial cardioplegic arrest was felt to be excellent.  Repeat doses of  retrograde cardioplegia administered intermittently throughout the  crossclamp portion of the operation to maintain left ventricular septal  temperature below 15 degrees centigrade.   The ascending aorta is transected at its midsection just below the aortic  crossclamp.  There is a large amount of jelly-like clot within the false  lumen of the ascending aorta.  There is a great deal of inflammation around  the exterior surface of the aortic wall itself, suggesting  subacute  presentation of this aortic dissection.  The aorta is trimmed back to the  level of the sinotubular junction.  The dissection extends down below the  sinotubular junction overlying the noncoronary cusp and the right coronary  cusp of the aortic valve.  The clot was evacuated from this region and the  valve was resuspended using 4-0 Prolene pledgeted sutures to resuspend each  of the three commissures.  The layers of the wall of the aortic root are now  reapproximated with BioGlue.  The 26 mm straight Hemashield graft is sewn  end-to-end to the aortic root at the level of the sinotubular junction using  interrupted 2-0 Ethibond horizontal mattress pledgeted sutures  circumferentially.  The proximal anastomoses is reinforced with BioGlue.  This proximal aortic graft was now tucked out of the way to facilitate  exposure.   The patient is placed in steep Trendelenburg position.  High-dose sodium Pentothal anesthesia is administered and the patient is given 1 gram of Solu-  Medrol intravenously.  The patient's head is packed in ice after verifying  core body temperature and a temperature of the hypopharynx less than 18  degrees centigrade.  Total circulatory arrest is begun and aortic crossclamp  was removed.  Dissection is continued to define the anatomy of the aortic  arch.  The hematoma extends throughout the entire aortic arch past the  origin of the left subclavian artery.  The origins of the innominate artery,  the left carotid artery, and the left subclavian artery are VT amputated  from the aortic arch and trimmed to facilitate individual anastomoses.  A  vascular clamp placed across the innominate artery at this juncture and  partial antegrade cerebral perfusion is reinstituted via the right axillary  artery after a total circulatory arrest time of 8 minutes.   The Hemashield Gold 14 x 10 x 10 mm branched vascular graft is chosen to  reconstruct the anastomoses for the  great vessels of the arms and head.  After trimming each individual leg of the graft to an appropriate length,  the left subclavian artery is initially anastomosed in an end-to-end fashion  to one of the 10 mm branches of the vascular graft.  The left carotid artery  is then anastomosed to a second 10 mm branch in an end-to-end fashion.  Finally, the innominate artery is sewn in an end-to-end fashion to the 14 mm  branch of this trifurcated graft.  A clamp was now placed across the graft  itself and antegrade cerebral perfusion and perfusion to both upper  extremities are reconstituted after 69 minutes of partial  antegrade  perfusion.   A 26 mm straight Hemashield vascular graft is invaginated on itself to  construct an elephant trunk distal anastomoses.  Initially a 28-mm graft is  chosen, but it appears that this is actually too large for the size of the  distal aorta and the 28 mm graft is discarded.  A 26-mm graft was  invaginated down into the descending aorta making sure it is placed into the  true lumen of the descending aorta with care to avoid injury to any of the  remaining layers of the aortic wall.  The distal anastomoses is constructed  using several interrupted 2-0 Ethibond horizontal mattress pledgeted sutures  along the back wall where the aortic wall is particularly friable.  After  this is completed a 3-0 Prolene suture was run circumferentially around the  entire distal anastomoses.  After completion of the distal anastomoses, the  proximal end of this graft is pulled out of the descending aorta and this  distal anastomoses is subsequently reinforced with BioGlue.  The proximal  end of this 26-mm Hemashield graft was beveled to appropriate length and  subsequently sewn in an end-to-end fashion to the beveled distal end of the  previous graft placed to the proximal ascending thoracic aorta.  Thermal  cautery is utilized to create an elliptical hole in the anterior surface  of the ascending aortic graft, the proximal end of the trifurcated graft to the  cranial vessels and arms is now sewn in end-to-side fashion onto the  anterior surface of the ascending aortic graft.  The aortic crossclamp was  subsequently removed after a total crossclamp time of 147 minutes.   The retrograde cardioplegic catheter was removed.  All surgical anastomoses  of the grafts are inspected for hemostasis and rewarming is begun.  The  lungs are ventilated to facilitate rewarming.  Eventually normal sinus  rhythm resumes and the left ventricular vent was removed.  Epicardial pacing  wires were fixed to right ventricular outflow tract and to the right atrial  appendage.  The patient is rewarmed to 37 degrees rectal temperature.   Initial attempts at weaning from cardiopulmonary bypass were notable for the  presence of severe hypertensive cardiomyopathy with inadequate filling of  the left ventricle despite relatively high filling pressures.  Cardiopulmonary bypass is continued and initially the patient is started on  dopamine.  After a second brief attempt at weaning from cardiopulmonary  bypass was notably unsuccessful, the patient was rested for an additional 13  minutes on cardiopulmonary bypass and low-dose milrinone and epinephrine  infusions are begun.  The patient is subsequently weaned from  cardiopulmonary bypass uneventfully.  The patient's rhythm at separation  from bypass is normal sinus rhythm.  The patient is weaned from bypass on  low-dose epinephrine, milrinone, and dopamine.  Total cardiopulmonary bypass  time for the operation is 273 minutes.  Follow-up transesophageal  echocardiogram performed by Dr. Noreene Larsson after separation from bypass  demonstrates preserved left ventricular systolic function.  The aortic valve  was functioning normally with no significant residual aortic insufficiency.  There is mild mitral regurgitation.  There is insignificant residual air.    Protamine is administered to reverse the anticoagulation.  Venous cannula is  removed uneventfully.  The arterial graft to the axillary artery is  transected 1 cm from the anastomosis to the axillary artery and oversewn  with Prolene suture.  The patient is transfused two packs of adult platelets  and 2 units fresh frozen plasma due to  coagulopathy.  The patient was also  given one dose of NovoSeven recombinant factor VII for coagulopathy.  Meticulous surgical hemostasis ascertained.  The mediastinum was irrigated  with saline solution containing vancomycin.   The mediastinum and left pleural space are drained using three chest tubes  exited through separate stab incisions inferiorly.  The soft tissues  anterior to the aortic graft are reapproximated loosely with the  pericardium.  The sternum was closed with double-strength sternal wire.  The  soft tissues anterior sternum are closed in multiple layers and the skin is  closed with a running subcuticular skin closure.  The right subclavian incision is also closed in multiple layers and skin incision closed with a  running subcuticular skin closure.   The patient tolerated the procedure well and was transported the surgical  intensive care unit in stable condition.  There are no intraoperative  complications.  All sponge, instrument and needle counts were verified  correct at completion of the operation.  The patient was transfused 2 units  packed red blood cells due to anemia and volume requirement during  cardiopulmonary bypass.      Salvatore Decent. Cornelius Moras, M.D.  Electronically Signed     CHO/MEDQ  D:  04/27/2006  T:  04/30/2006  Job:  [id number]   cc:   Lyn Records, M.D.  Maree Krabbe, M.D.

## 2010-12-02 NOTE — Discharge Summary (Signed)
[name], [name]                ACCOUNT NO.:  [id number]   MEDICAL RECORD NO.:  [id number]          PATIENT TYPE:  INP   LOCATION:  2025                         FACILITY:  [facility]   PHYSICIAN:  Coral Ceo, P.A.     DATE OF BIRTH:  [birth year]   DATE OF ADMISSION:  04/21/2006  DATE OF DISCHARGE:                                 DISCHARGE SUMMARY   Audio too short to transcribe (less than 5 seconds)      Coral Ceo, P.A.     GC/MEDQ  D:  05/04/2006  T:  05/04/2006  Job:  [id number]

## 2010-12-02 NOTE — Discharge Summary (Signed)
[name], [name]                ACCOUNT NO.:  [id number]   MEDICAL RECORD NO.:  [id number]          PATIENT TYPE:  INP   LOCATION:  1419                         [facility]AC[facility]   PHYSICIAN:  Corinna L. Lendell Caprice, MDDATE OF BIRTH:  [birth year]   DATE OF ADMISSION:  01/04/2005  DATE OF DISCHARGE:  01/11/2005                                 DISCHARGE SUMMARY   DIAGNOSES:  1.  Hypertensive emergency.  2.  Chronic kidney disease, stage IV.  3.  Hypokalemia.  4.  Retinal hemorrhage.  5.  Hyperlipidemia.   DISCHARGE MEDICATIONS:  1.  Norvasc 10 mg a day.  2.  Zocor 20 mg nightly.  3.  Zemplar 1 mcg every Monday, Wednesday, Friday.  4.  Clonidine 0.3 mg p.o. b.i.d.  5.  Labetalol 600 mg p.o. b.i.d.  6.  Lasix 40 mg p.o. b.i.d.   CONDITION:  Stable.   DIET:  Low salt, low cholesterol.   ACTIVITY:  Ad lib.   FOLLOW UP:  With nephrology in two to three weeks and kidney options classes  in two to three weeks.   CONSULTATIONS:  Dr. Caryn Section.   PROCEDURE:  None.   PERTINENT LABORATORIES:  Myoglobin greater than 500.  CPK MB 4.9.  Troponin  less than 0.05.  ANA negative.  Antistreptolysin antibody less than 25.  Urinalysis showed large hemoglobin, greater than 300 protein.  A 24 hour  urine for protein was 2108. A 24 hour urine creatinine clearance was 17  measured.  A 24 hour urine for catecholamines was normal.  Urine drug screen  negative.  C3 compliment, C4 compliment normal.  HIV nonreactive.  LDL 157,  HDL 60, triglycerides 60, total cholesterol 229.  Serum protein  electrophoresis normal.  Phosphorus 5.1, magnesium 2.2, total bilirubin 1.8,  AST 50, albumin 3.3, creatinine 4.1, BUN 28, glucose 130, potassium 2.8.  Hemoglobin A1c was 5.3.  CBC was unremarkable.   SPECIAL STUDIES AND RADIOLOGY:  EKG showed normal sinus rhythm with left  atrial enlargement, left ventricular hypertrophy by voltage with strain  pattern.  MRA unofficial report was normal.  Renal ultrasound showed  no  hydronephrosis.  Renal cortex may be mildly echogenic bilaterally.  Chest x-  ray showed cardiomegaly, nothing active.   HISTORY AND HOSPITAL COURSE:  [name] is a pleasant 40 year old black male  who was referred to the emergency room by an ophthalmologist who noted  papule edema, high blood pressure and retinal hemorrhage. The patient had  seen an ophthalmologist for blurry vision for a week.  He had blood  pressures manually of 300/157, heart rate 105, otherwise vital signs were  normal.  Other than the funduscopic exam, he had an unremarkable  examination.  He was found to have a low potassium and renal failure.  He  was admitted to the intensive care unit and started on a nitroglycerin drip  and a labetalol drip and labetalol orally.  He was started on oral  antihypertensive and was weaned off the nitroglycerin drip.  Also, his  echocardiogram showed ejection fraction of 50-55%, no wall motion  abnormalities, markedly  increased left ventricular wall thickness with a  pseudo normal left ventricular filling pattern with concomitant abnormal  relaxation and mild aortic mitral pulmonic and tricuspid regurgitation.  Nephrology was consulted.  His creatinine remained relatively unchanged.  His blood pressure was very difficult to control.  He was also started on  Zocor for his hyperlipidemia.  At the time of discharge, his blood pressures  were ranging about 130-160/90-100.  His potassium was repleted and at the  time of discharge, he is in stable condition.  Total time on the day of  discharge is 40 minutes.       CLS/MEDQ  D:  01/11/2005  T:  01/11/2005  Job:  [id number]

## 2010-12-02 NOTE — H&P (Signed)
[name], [name]                ACCOUNT NO.:  [id number]   MEDICAL RECORD NO.:  [id number]          PATIENT TYPE:  INP   LOCATION:  0159                         FACILITY:  [facility]   PHYSICIAN:  Theone Stanley, MD   DATE OF BIRTH:  Jul 19, 1970   DATE OF ADMISSION:  01/04/2005  DATE OF DISCHARGE:                                HISTORY & PHYSICAL   CHIEF COMPLAINT:  Retinal hemorrhage.   HISTORY OF PRESENT ILLNESS:  [name] is a 40 year old African American  gentleman presenting with a one week history of blurred vision.  He saw an  ophthalmologist today and subsequently an ophthalmologist observed papillary  edema and hemorrhage.  He indicated that the patient needs to go to the  emergency room.  He arrived to the emergency room  and it was noted that he  had a blood pressure of 253/157 that was automatic and manual it was even  higher, possibly 300/157.  The patient was given initially some Labetalol  and placed on a nitro drip.  The patient states he does not have any  headaches and no light headed.  His blurred vision happened a week ago but  he did not seem to have other symptoms.  When asked whether he had high  blood pressure, he said when he was going through a separation, he was told  he had borderline hypertension and about two years ago, he had gone for a  physical and his blood pressure at that time was 138/78.   PAST MEDICAL HISTORY:  None the patient knows of.   MEDICATIONS:  None.   ALLERGIES:  None.   PAST SURGICAL HISTORY:  None.   FAMILY HISTORY:  Significant for heart disease.   SOCIAL HISTORY:  The patient lives in [place], he is married, has two  children.  No tobacco, alcohol, or illicit drug use.  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   REVIEW OF SYMPTOMS:  The patient denied any fevers, chills, shortness of  breath, chest pain, wheezing, cough, dysuria, hematuria, change in his bowel  habits, blood in his  stools, dark tarry stools, any abnormal intolerance to  heat or cold.   PHYSICAL EXAMINATION:  VITAL SIGNS:  Blood pressure on admission was 257/157, heart rate 105,  respiratory rate 20, saturation 95% on room air.  HEENT:  Head normocephalic, atraumatic.  Eyes 3 mm, pupils reactive to  light, extraocular movements intact.  Ears with no discharge.  Throat clear,  no erythema or exudate.  Mucosa moist.  NECK:  Supple, no lymphadenopathy, no JVD.  HEART:  Regular rate and rhythm, no murmurs, gallops, and rubs heard.  LUNGS:  Clear to auscultation bilaterally.  ABDOMEN:  Soft, nontender, nondistended, no organomegaly.  EXTREMITIES:  No cyanosis, clubbing, and edema.  NEUROLOGICAL:  The patient was alert and oriented x 3.  GU:  Deferred.   LABORATORY DATA:  The patient had a chest x-ray which showed cardiomegaly,  no acute disease.  White count 6, hemoglobin 13, hematocrit 40, platelets  157.  Sodium 139, potassium 2.3,  chloride 93, CO2 33, glucose 98, BUN 31,  creatinine 4.3, total bilirubin 1.8, alkaline phos 55, AST 60, ALT 40, total  protein 7.8, albumin 4.5, calcium 9.5.  Urinalysis showed specific gravity  of 1.013, pH 7, large amount of blood, protein greater than 300, nitrate  negative, leukocyte esterase negative, RBCs at high powered field 0-2.   ASSESSMENT/PLAN:  [name] is a 40 year old African American gentleman  presenting with hypertensive emergency.   1.  Hypertensive emergency.  The patient was originally given Labetalol and      then placed on a nitro drip.  The goal blood pressure will be 177/126,      this is 25% of his original presenting blood pressure over the next 24      hours, will try to keep his blood pressure at that level and in      subsequent days decrease it gradually.  It is unclear whether the      patient has essential hypertension or there might be a secondary cause      for his hypertension including chronic kidney disease or other       underlying issues like pheochromocytoma.  At this point in time, I      suspect he has essential hypertension that has not been treated.  We      will try to get his blood pressure down to help out with his      hypertensive emergency.  Will also get a 2D echo to see whether there is      evidence of hypertrophy.  2.  Acute renal failure.  It is unclear whether this is a chronic or an      issue that has caused his hypertension or acute issue secondary to his      hypertension at his presentation.  We will obtain a renal ultrasound, 24      hour protein and creatinine, will ask the nephrologist to come by and      help manage this.  3.  Hypokalemia.  I am not exactly sure why he is hypokalemic, this is not      consistent with acute renal failure, consideration of      hyperaldosteronism, we will replace his potassium at this point in time.  4.  Papillary edema and retinal hemorrhage.  This was seen at the      ophthalmologist, the main issue is getting his blood pressure under      control and this will help out with his vision.  Hopefully there will be      no permanent damage.       AEJ/MEDQ  D:  01/04/2005  T:  01/04/2005  Job:  [id number]

## 2010-12-02 NOTE — Consult Note (Signed)
Contreras, Billy                ACCOUNT NO.:  [id number]   MEDICAL RECORD NO.:  [id number]          PATIENT TYPE:  INP   LOCATION:  6731                         FACILITY:  [facility]   PHYSICIAN:  Lyn Records, M.D.   DATE OF BIRTH:  [birth year]   DATE OF CONSULTATION:  04/24/2006  DATE OF DISCHARGE:                                   CONSULTATION   .   REASON FOR CONSULTATION:  Heart arrhythmia.   CONCLUSIONS:  1. End-stage renal disease likely secondary to malignant hypertension.  2. Malignant hypertension.      a.     Renal failure.      b.     Cardiomegaly with evidence of heart failure.  3. Paroxysmal atrial flutter, asymptomatic.  The patient is currently in      sinus rhythm. This rhythm disturbances probably related to hypertensive      heart disease and possible component of uremic pericarditis.   RECOMMENDATIONS:  1. Aggressive blood pressure control using calcium channel blocker,      clonidine and beta blocker therapy as well as diuresis to remove      volume.  2. Beta blocker therapy should probably be limited at Toprol and      discontinue labetalol.  3. Dialysis.  4. 2-D Doppler echocardiogram.  5. No specific antiarrhythmic is needed at this time, and hopefully      recurrences of the arrhythmia will not occur once his blood pressure is      controlled and his uremic state is improved with dialysis.  6. If recurrent episodes of atrial flutter, may require long-term Coumadin      therapy, but would not start that therapy at this time.  7. Consider Lovenox therapeutic doses until fully ambulatory and volume      status is controlled.   COMMENT:  [name] is 17 and has a history of severe hypertension, was  admitted at this time with heart failure denoted by lower extremity  swelling, exertional dyspnea and fatigue.  Does not have any chest pain.  He  was admitted to the hospital with a creatinine greater than 50 and BUN  greater than 200, and his chest x-ray  demonstrated cardiomegaly, left  pleural effusion and bilateral lower lobe alveolar air space disease.  He  states his breathing is improved.  He recently had a dialysis access placed  24 hours ago.  Around that time, he developed an episode of atrial flutter  with variable block, heart rate of around 100, but has subsequently reverted  back to normal sinus rhythm with first-degree AV block.  He was unaware of  this and has had no tachy palpitations or complaints.  He specifically  denies syncope.   CURRENT MEDICAL REGIMEN:  1. Catapres 0.1 mg q.6 h.  2. Toprol XL 100 mg nightly.  3. Norvasc 5 mg per day.  4. Normodyne 2 mg b.i.d.  5. Aranesp.  6. InFeD.  7. Percocet.   ALLERGIES:  None noted.   HABITS:  He denies alcohol intake and does not smoke. He denies illicit  substance  use.   SIGNIFICANT MEDICAL PROBLEMS:  Are noted above   PHYSICAL EXAMINATION:  GENERAL: [name]'s face is recognizable to me.  He has  been an employee here at [employer].  VITAL SIGNS: His blood pressure is 170/85; heart rate is 75.  Monitors  reveal sinus rhythm with first-degree AV block. O2 saturations 95%.  SKIN: Warm and dry.  No obvious JVD is noted with the patient sitting.  LUNGS:  Decreased breath sounds heard at both bases.  No wheezing is heard.  There is dullness to percussion of the left base.  CARDIAC: Exam reveals an S4 gallop and a soft 1/6 systolic murmur.  ABDOMEN:  Soft with no obvious ascites.  EXTREMITIES:  Reveal no edema.  NEUROLOGICAL:  Exam is grossly intact.   EKG reveals LVH, left atrial abnormality, first-degree AV block.   Chest x-ray: Cardiomegaly, left pleural effusion, lower lobe air space  disease bilaterally.   LABORATORY DATA:  Includes a BUN and creatinine this morning of 167 and 19,  respectively.  Potassium 4.3. Hemoglobin 11.3. White blood cell count is  normal. CK-MB and troponins have been mildly elevated since admission  secondary to renal failure.  Troponins are in the 0.2 or less range.  CK-MBs  have been less than 14. BNP on October 6 was 193. TSH level has not been  obtained.   DISCUSSION:  The patient has had at least one episode of atrial flutter  documented here in the hospital that was asymptomatic.  This is probably  multifactorial related to uremia with possible pericardial inflammation,  severe hypertension with probable hypertensive heart disease and atrial and  LV cavity structural abnormalities.  He has spontaneously reverted back to  normal sinus rhythm.  No specific antiarrhythmic therapy is needed. Ischemic  evaluation is not needed. If he has recurrences of atrial flutter, may need  antiarrhythmic therapy, and certainly Coumadin therapy would be a  consideration if there are recurrences as well. For the time being, Lovenox  therapy or some form of antithrombotic therapy should be considered until he  is fully ambulatory and dialysis is starting to improve his metabolic state.      Lyn Records, M.D.  Electronically Signed     HWS/MEDQ  D:  04/24/2006  T:  04/25/2006  Job:  [id number]   cc:   Wilber Bihari. Caryn Section, M.D.

## 2010-12-02 NOTE — Op Note (Signed)
[name]                ACCOUNT NO.:  [id number]   MEDICAL RECORD NO.:  [id number]          PATIENT TYPE:  INP   LOCATION:  2305                         FACILITY:  [facility]   PHYSICIAN:  Guadalupe Maple, M.D.  DATE OF BIRTH:  02/03/1971   DATE OF PROCEDURE:  04/27/2006  DATE OF DISCHARGE:                                 OPERATIVE REPORT   PROCEDURE:  Intraoperative transesophageal echocardiography.   [name] is a 40 year old African American man who developed  malignant hypertension and renal failure and subsequently developed  dissection of the aorta which involved initially his descending aorta, but  extended into the aortic arch and proximal ascending aorta.  He is now  scheduled to undergo repair of the aortic dissection and possible aortic  arch replacement.  Intraoperative transesophageal echocardiography was  requested to evaluate the extent of the aortic involvement, to determine if  the dissection involved the aortic valve and its suitability for repair or  replacement, and to assess the left ventricular function and to determine if  any other valvular pathology was present.   The patient was brought to the operating room at [facility] and  general anesthesia was induced without difficulty.  The trachea was  intubated without difficulty.  The transesophageal echocardiography probe  was then inserted into the esophagus without difficulty.   IMPRESSION:  1. Ascending aorta.  There was a large intramural hematoma involving the      ascending aorta.  The proximal extent of the hematoma appeared to be      just superior to the right sinotubular ridge adjacent to the right      sinus of Valsalva.  The dissection then appeared to extend into the      proximal ascending aorta as far as could be interrogated by the      transesophageal echocardiography probe.  There was no visible flap      appreciated.  2. Aortic valve.  The aortic valve was trileaflet  and the aortic      dissection did not appear to involve the aortic valve.  There was trace      aortic insufficiency, but the leaflets collected well and were not      prolapsing.  There was no restriction to opening of the aortic valve.      The aortic annulus measured 2.45 cm.  3. Mitral valve.  The mitral valve leaflets appeared somewhat thickened,      but coapted well without prolapse or fluttering, and there appeared to      be trace to 1+ mitral insufficiency.  There were no torn chordae seen.  4. Left ventricle.  There was severe concentric left ventricular      hypertrophy.  The left ventricular wall thickness measured 1.7 cm at      the posterior wall and mid the papillary level at end diastole.      Ejection fraction was estimated at 55-60%.  5. Pericardium.  There was a moderate pericardial effusion present which      appeared primarily to involve the lateral aspect  of the heart and      pericardial space.  6. Right ventricle.  The right ventricular size appeared to be within      normal limits and there was contractility of the right ventricular free      wall.  7. Tricuspid valve.  The tricuspid valve appeared to open normally and the      leaflets appeared structurally normal.  There was trace to 1+ tricuspid      insufficiency.  8. Interatrial septum.  The interatrial septum was intact without evidence      of patent foramen ovale or atrial septal defect.  9. Left atrium.  There was no thrombus noted in the left atrium or left      atrial appendage.  10.Descending aorta.  Interrogation of the descending aorta showed an      aortic dissection with true lumen and false lumen identified.  The      false lumen appeared to occupy approximately 70% of the cross-sectional      area of the descending aorta in short access.   POSTBYPASS FINDINGS:  1. Ascending aorta.  There appeared to be a graft in the proximal      ascending aorta where the dissected ascending aorta had  previously      been.  The graft appeared to originate just distal to the sinotubular      ridge of the aortic root.  2. Aortic valve.  The aortic valve leaflets open normally and there was      trace aortic insufficiency.  3. Mitral valve.  The mitral leaflets, again, appeared thickened and there      was trace to 1+ mitral insufficiency, but no prolapse of the mitral      valve and no torn chordae or flail segments.  4. Left ventricle.  There was, again, severe concentric hypertrophy of the      left ventricle noted.  Initially following separation of      cardiopulmonary bypass, there was a global hypokinesis of the left      ventricle, but this appeared to markedly improve with time and      inotropic support.  Towards the completion of the procedure, there was      vigorous contractility of the left ventricle with ejection fraction      estimated at 60%.  5. Right ventricle.  The right ventricular cavity did not appear enlarged      and there was good contractility of the right ventricular free wall.  6. Tricuspid valve.  There was, again, trace tricuspid insufficiency.           ______________________________  Guadalupe Maple, M.D.     DCJ/MEDQ  D:  04/28/2006  T:  04/29/2006  Job:  [id number]

## 2010-12-02 NOTE — Discharge Summary (Signed)
[name], [name]                ACCOUNT NO.:  [id number]   MEDICAL RECORD NO.:  [id number]          PATIENT TYPE:  INP   LOCATION:  2025                         FACILITY:  [facility]   PHYSICIAN:  Salvatore Decent. Cornelius Moras, M.D. DATE OF BIRTH:  22-Jun-1971   DATE OF ADMISSION:  04/21/2006  DATE OF DISCHARGE:  05/05/2006                                 DISCHARGE SUMMARY   PRIMARY ADMITTING DIAGNOSES:  1. Hypertension.  2. Acute on chronic renal failure.   ADDITIONAL/DISCHARGE DIAGNOSES:  1. Malignant hypertension.  2. Acute on chronic renal failure.  3. Type A aortic dissection.  4. Atrial fibrillation/atrial flutter.  5. Severe hypertensive cardiomyopathy.  6. Anemia.   PROCEDURES PERFORMED:  1. Left brachiocephalic AV fistula.  2. Right ultrasound-guided IJ Diatek catheter placement.  3. Median sternotomy with replacement of ascending thoracic aorta and      transverse aortic arch with elephant trunk  distal anastomoses and      resuspension of the native aortic valve using profound hypothermia and      total circulatory arrest.   HISTORY:  The patient is a 40 year old African American male with a history  of longstanding hypertension and chronic kidney disease.  He has been lost  to followup since early in 2007.  He recently presented to the [facility] with worsening orthopnea, dyspnea, and lower extremity  edema.  He was diagnosed with congestive heart failure, hypertension, and  renal failure.  He was started on Lasix, lisinopril, and his beta-blocker  dose was titrated.  He returned for followup in 48 hours and his repeat labs  showed a creatinine of 19.8 and a BUN of 211.  Please see previously  dictated history and physical for complete details.  Because of these  findings, he was referred to the  Emergency Department at [facility] and was subsequently admitted  for further evaluation and treatment.   HOSPITAL COURSE:  Because of his severe and  significantly worsening renal  failure, he was admitted to the nephrology service.  A CVTS vascular consult  was obtained and he underwent placement of a right IJ Diatek catheter and a  left AV fistula for dialysis access.  Hemodialysis was subsequently  initiated.  He developed paroxysmal atrial flutter in addition to his  malignant hypertension and a cardiology consult was obtained.  Dr. Verdis Prime saw the patient and he underwent a 2-D echocardiogram which revealed  severe left ventricular hypertrophy with mild left ventricular systolic  dysfunction and ejection fraction estimated at 50%.  There was moderate  aortic insufficiency with mild dilatation of the aortic root and ascending  thoracic aorta.  An aortic dissection involving the descending thoracic  aorta was noted and a subsequent cardiothoracic surgery consultation was  obtained.  Dr. Tressie Stalker saw the patient and ordered a CT angiogram  which showed a definite type B aortic dissection.  Please see his previously  dictated consultation note for complete details.  Based on the findings, Dr.  Cornelius Moras discussed multiple options with the patient and his family and  ultimately [name]  agreed to proceed with surgical intervention.  He was  taken to the operating room on April 27, 2006 and underwent replacement of  his aorta and transverse aortic arch, all as described in detail above,  performed by Dr. Cornelius Moras.  He tolerated the procedure well and he was  transferred to the SICU in stable condition.  He did remain sedated on the  ventilator and was allowed to slowly wean over the course of his  postoperative day.  He was able to be extubated in the evening of  postoperative day one.  His postoperative course has been prolonged by  poorly controlled hypertension requiring IV labetalol as well as a p.o. ACE  inhibitor.  He also developed postoperative atrial fibrillation and was  started on an amiodarone drip.  He subsequently  converted to normal sinus  rhythm and was switched to a p.o. dose of amiodarone.  He was anemic  postoperatively and was started on INFeD, Aranesp, and did require a  transfusion of packed red blood cells.  His cardiac status stabilized and by  postoperative day 6, he was able to be transferred to the floor.  During his  postoperative course, he has continued hemodialysis on a regular basis and  has been followed closely by the nephrology service.  His blood pressure is  improving at present and he is running in the 140-150/70-90 range.  He is  maintaining normal sinus rhythm on his current medications.  He remained  somewhat volume overloaded.  His labs on postoperative day 7 show a  hemoglobin of 9.5, hematocrit 29.1, white count 14.1, platelets 217, sodium  137, potassium 3.8, BUN 28, creatinine 5.4.  He is ambulating in the halls,  cardiac rehab phase-I, and is doing well.  He has remained afebrile and his  other vital signs have been stable.  His incisions are all healing well.  He  continues to have a pericardial rub which was present preoperatively and  noted in the operating room.  This has remained stable and is improving.  It  is felt that if he continues to remain stable over the next 24 hours and  requires no other additional blood pressure or heart rhythm interventions,  he will hopefully be ready for discharge home on May 05, 2006.  He is  scheduled to undergo a short dialysis session in the morning prior to  discharge and has been scheduled to start outpatient dialysis on a Monday,  Wednesday, Friday schedule.   DISCHARGE MEDICATIONS:  1. Aspirin 81 mg daily.  2. Labetalol 800 mg b.i.d.  3. Lotensin 40 mg nightly.  4. Amiodarone 200 mg b.i.d.  5. Nephro-Vite 1 daily.  6. PhosLo 667 mg t.i.d.  7. Tylox 1-2 q. 4 hours p.r.n. for pain.   DISCHARGE INSTRUCTIONS:  He is asked to refrain from driving, heavy lifting, or strenuous activity.  He may continue ambulating  daily and use his  incentive spirometer.  He may shower daily and clean his incisions with soap  and water.  He will continue a renal-restricted diet.   DISCHARGE FOLLOWUP:  He is scheduled to begin outpatient hemodialysis on  Monday, Wednesday, Friday at 12:15 p.m..  He is asked to make an appointment  see Dr. Katrinka Blazing in 2 weeks and have a chest x-ray at that visit.  He will then  see Dr. Cornelius Moras on Monday, November12, at 10:45 a.m..  He will contact our  office in the interim if he experiences any problems or has questions.  Coral Ceo, P.A.      Salvatore Decent. Cornelius Moras, M.D.  Electronically Signed    GC/MEDQ  D:  05/04/2006  T:  05/06/2006  Job:  [id number]   cc:   Cecille Aver, M.D.  Lyn Records, M.D.

## 2011-05-30 ENCOUNTER — Other Ambulatory Visit (HOSPITAL_COMMUNITY): Payer: Self-pay | Admitting: Nephrology

## 2011-05-30 DIAGNOSIS — N186 End stage renal disease: Secondary | ICD-10-CM

## 2011-06-06 ENCOUNTER — Other Ambulatory Visit (HOSPITAL_COMMUNITY): Payer: Medicare Other

## 2011-06-20 ENCOUNTER — Ambulatory Visit (HOSPITAL_COMMUNITY): Payer: Medicare Other

## 2011-06-22 ENCOUNTER — Ambulatory Visit (HOSPITAL_COMMUNITY): Payer: Medicare Other

## 2011-07-27 ENCOUNTER — Other Ambulatory Visit (HOSPITAL_COMMUNITY): Payer: Self-pay | Admitting: Nephrology

## 2011-07-27 DIAGNOSIS — N186 End stage renal disease: Secondary | ICD-10-CM

## 2011-08-03 ENCOUNTER — Ambulatory Visit (HOSPITAL_COMMUNITY): Payer: Medicare Other | Attending: Nephrology

## 2014-08-08 ENCOUNTER — Other Ambulatory Visit: Payer: Self-pay | Admitting: Nephrology

## 2014-08-08 ENCOUNTER — Encounter (HOSPITAL_COMMUNITY): Payer: Self-pay | Admitting: Emergency Medicine

## 2014-08-08 ENCOUNTER — Emergency Department (HOSPITAL_COMMUNITY)
Admission: EM | Admit: 2014-08-08 | Discharge: 2014-08-08 | Disposition: A | Discharge status: Home / Self Care | Payer: Medicare Other | Attending: Emergency Medicine | Admitting: Emergency Medicine

## 2014-08-08 DIAGNOSIS — T82868A Thrombosis of vascular prosthetic devices, implants and grafts, initial encounter: Secondary | ICD-10-CM | POA: Diagnosis present

## 2014-08-08 DIAGNOSIS — E875 Hyperkalemia: Secondary | ICD-10-CM | POA: Diagnosis not present

## 2014-08-08 DIAGNOSIS — Z7982 Long term (current) use of aspirin: Secondary | ICD-10-CM | POA: Insufficient documentation

## 2014-08-08 DIAGNOSIS — Y841 Kidney dialysis as the cause of abnormal reaction of the patient, or of later complication, without mention of misadventure at the time of the procedure: Secondary | ICD-10-CM | POA: Insufficient documentation

## 2014-08-08 DIAGNOSIS — T829XXA Unspecified complication of cardiac and vascular prosthetic device, implant and graft, initial encounter: Secondary | ICD-10-CM

## 2014-08-08 DIAGNOSIS — N186 End stage renal disease: Secondary | ICD-10-CM | POA: Insufficient documentation

## 2014-08-08 DIAGNOSIS — I120 Hypertensive chronic kidney disease with stage 5 chronic kidney disease or end stage renal disease: Secondary | ICD-10-CM | POA: Diagnosis not present

## 2014-08-08 DIAGNOSIS — Z79899 Other long term (current) drug therapy: Secondary | ICD-10-CM | POA: Diagnosis not present

## 2014-08-08 HISTORY — DX: Disorder of kidney and ureter, unspecified: N28.9

## 2014-08-08 HISTORY — DX: Essential (primary) hypertension: I10

## 2014-08-08 LAB — CBC WITH DIFFERENTIAL/PLATELET
BASOS ABS: 0 10*3/uL (ref 0.0–0.1)
Basophils Relative: 1 % (ref 0–1)
Eosinophils Absolute: 0.1 10*3/uL (ref 0.0–0.7)
Eosinophils Relative: 1 % (ref 0–5)
HCT: 35.3 % — ABNORMAL LOW (ref 39.0–52.0)
Hemoglobin: 11.5 g/dL — ABNORMAL LOW (ref 13.0–17.0)
LYMPHS ABS: 0.7 10*3/uL (ref 0.7–4.0)
LYMPHS PCT: 12 % (ref 12–46)
MCH: 28.3 pg (ref 26.0–34.0)
MCHC: 32.6 g/dL (ref 30.0–36.0)
MCV: 86.9 fL (ref 78.0–100.0)
Monocytes Absolute: 0.4 10*3/uL (ref 0.1–1.0)
Monocytes Relative: 7 % (ref 3–12)
NEUTROS ABS: 5 10*3/uL (ref 1.7–7.7)
NEUTROS PCT: 79 % — AB (ref 43–77)
PLATELETS: 293 10*3/uL (ref 150–400)
RBC: 4.06 MIL/uL — ABNORMAL LOW (ref 4.22–5.81)
RDW: 14.3 % (ref 11.5–15.5)
WBC: 6.3 10*3/uL (ref 4.0–10.5)

## 2014-08-08 LAB — BASIC METABOLIC PANEL
ANION GAP: 22 — AB (ref 5–15)
BUN: 65 mg/dL — ABNORMAL HIGH (ref 6–23)
CHLORIDE: 92 mmol/L — AB (ref 96–112)
CO2: 21 mmol/L (ref 19–32)
CREATININE: 18.66 mg/dL — AB (ref 0.50–1.35)
Calcium: 9 mg/dL (ref 8.4–10.5)
GFR calc Af Amer: 3 mL/min — ABNORMAL LOW (ref >90)
GFR calc non Af Amer: 3 mL/min — ABNORMAL LOW (ref >90)
GLUCOSE: 90 mg/dL (ref 70–99)
POTASSIUM: 6.2 mmol/L — AB (ref 3.5–5.1)
Sodium: 135 mmol/L (ref 135–145)

## 2014-08-08 MED ORDER — SODIUM POLYSTYRENE SULFONATE 15 GM/60ML PO SUSP
30.0000 g | Freq: Once | ORAL | Status: AC
  Administered 2014-08-08: 30 g via ORAL
  Filled 2014-08-08: qty 120

## 2014-08-08 MED ORDER — SODIUM POLYSTYRENE SULFONATE 15 GM/60ML PO SUSP
60.0000 g | Freq: Once | ORAL | Status: AC
  Administered 2014-08-08: 60 g via ORAL
  Filled 2014-08-08: qty 240

## 2014-08-08 NOTE — ED Provider Notes (Signed)
CSN: [id number]     Arrival date & time 08/08/14  0844 History   First MD Initiated Contact with Patient 08/08/14 [phone]     Chief Complaint  Patient presents with  . Vascular Access Problem     (Consider location/radiation/quality/duration/timing/severity/associated sxs/prior Treatment) HPI Comments: Patient is a 44 yo M PMHx significant for HTN, ESRD on dialysis MWF presenting to the ED for AV fistula problem. Patient states today he did not feel his fistula had a thrill. Patient states he called the dialysis center as he is supposed to go be dialyzed today and was told to come to the emergency department for evaluation. He was last dialyzed on Wednesday, got to his goal weight. He denies any fevers, chills, cough, shortness of breath, chest pain, edema. He states he produces very little urine, last production with this morning. He states his fistula was placed in 2007, has not had any complications with his fistula since placement.   Past Medical History  Diagnosis Date  . Renal disorder   . Hypertension    Past Surgical History  Procedure Laterality Date  . Dialysis fistula creation Left   . Cardiac surgery  2007   No family history on file. History  Substance Use Topics  . Smoking status: Never Smoker   . Smokeless tobacco: Not on file  . Alcohol Use: No    Review of Systems  All other systems reviewed and are negative.     Allergies  Review of patient's allergies indicates no known allergies.  Home Medications   Prior to Admission medications   Medication Sig Start Date End Date Taking? Authorizing Provider  aspirin 81 MG tablet Take 81 mg by mouth daily.   Yes Historical Provider, MD  benazepril (LOTENSIN) 10 MG tablet Take 10 mg by mouth at bedtime as needed (blood pressure).  06/22/14  Yes Historical Provider, MD  calcium acetate (PHOSLO) 667 MG capsule Take 2,668 mg by mouth 3 (three) times daily with meals.    Yes Historical Provider, MD  folic acid-vitamin b  complex-vitamin c-selenium-zinc (DIALYVITE) 3 MG TABS tablet Take 1 tablet by mouth daily.   Yes Historical Provider, MD   BP 137/79 mmHg  Pulse 86  Temp(Src) 98.2 F (36.8 C)  Resp 16  Ht 5\' 9"  (1.753 m)  Wt 190 lb (86.183 kg)  BMI 28.05 kg/m2  SpO2 96% Physical Exam  Constitutional: He is oriented to person, place, and time. He appears well-developed and well-nourished. No distress.  HENT:  Head: Normocephalic and atraumatic.  Right Ear: External ear normal.  Left Ear: External ear normal.  Nose: Nose normal.  Mouth/Throat: Oropharynx is clear and moist.  Eyes: Conjunctivae are normal.  Neck: Normal range of motion. Neck supple.  Cardiovascular: Normal rate, regular rhythm, normal heart sounds and intact distal pulses.   Pulmonary/Chest: Effort normal and breath sounds normal. No respiratory distress.  Abdominal: Soft. There is no tenderness.  Musculoskeletal: Normal range of motion. He exhibits no edema.       Arms: Neurological: He is alert and oriented to person, place, and time.  Skin: Skin is warm and dry. He is not diaphoretic.  Psychiatric: He has a normal mood and affect.  Nursing note and vitals reviewed.   ED Course  Procedures (including critical care time) Medications  sodium polystyrene (KAYEXALATE) 15 GM/60ML suspension 60 g (60 g Oral Given 08/08/14 1201)  sodium polystyrene (KAYEXALATE) 15 GM/60ML suspension 30 g (30 g Oral Given 08/08/14 1228)    Labs  Review Labs Reviewed  BASIC METABOLIC PANEL - Abnormal; Notable for the following:    Potassium 6.2 (*)    Chloride 92 (*)    BUN 65 (*)    Creatinine, Ser 18.66 (*)    GFR calc non Af Amer 3 (*)    GFR calc Af Amer 3 (*)    Anion gap 22 (*)    All other components within normal limits  CBC WITH DIFFERENTIAL/PLATELET - Abnormal; Notable for the following:    RBC 4.06 (*)    Hemoglobin 11.5 (*)    HCT 35.3 (*)    Neutrophils Relative % 79 (*)    All other components within normal limits     Imaging Review No results found.   EKG Interpretation   Date/Time:  Saturday August 08 2014 11:21:19 EST Ventricular Rate:  88 PR Interval:  207 QRS Duration: 114 QT Interval:  397 QTC Calculation: 480 R Axis:   68 Text Interpretation:  Sinus rhythm Borderline prolonged PR interval Left  atrial enlargement Borderline intraventricular conduction delay Abnormal  T, consider ischemia, diffuse leads agree. lateral t waves increased  inversion c/w old. Confirmed by Donnald Garre, MD, Lebron Conners [phone]) on 08/08/2014  11:38:36 AM      9:22 AM Discussed patient with Dr. Imogene Burn who states IR should be consulted.   11:17 AM Dr. Arlean Hopping will see the patient.    MDM   Final diagnoses:  Complication of vascular access for dialysis, initial encounter  Hyperkalemia    Filed Vitals:   08/08/14 1200  BP: 137/79  Pulse: 86  Temp:   Resp: 16   Afebrile, NAD, non-toxic appearing, AAOx4.  I have reviewed nursing notes, vital signs, and all appropriate lab and imaging results for this patient.  Dr. Arlean Hopping has seen and evaluated the patient recommends providing patient with 60g of Kayexelate for today and 30g for tomorrow with planned intervention as an outpatient at nephrology clinic for AV Fistula clot. Patient would like to be discharged and is agreeable to the medical plan.   Patient d/w with Dr. Donnald Garre, agrees with plan.    Jeannetta Ellis, PA-C 08/08/14 1649  Arby Barrette, MD 08/13/14 [phone]

## 2014-08-08 NOTE — Discharge Instructions (Signed)
You will receive a call from the Nephrologist's office regarding your appointment time on Monday to have your access de-clotted. Please take the 60g of Kayexalate today as advised and the 30g tomorrow. Please read all discharge instructions and return precautions.    Dialysis Vascular Access Malfunction A vascular access is an entrance to your blood vessels that can be used for dialysis. A vascular access can be made in one of several ways:   Joining an artery to a vein under your skin to make a bigger blood vessel called a fistula.   Joining an artery to a vein under your skin using a soft tube called a graft.   Placing a thin, flexible tube (catheter) in a large vein, usually in your neck.  A vascular access may malfunction or become blocked.  WHAT CAN CAUSE YOUR VASCULAR ACCESS TO MALFUNCTION?  Infection (common).   A blood clot inside a part of the fistula, graft, or catheter. A blood clot can completely or partially block the flow of blood.   A kink in the graft or catheter.   A collection of blood (called a hematoma or bruise) next to the graft or catheter that pushes against it, blocking the flow of blood.  WHAT ARE SIGNS AND SYMPTOMS OF VASCULAR ACCESS MALFUNCTION?  There is a change in the vibration or pulse of your fistula or graft.  The vibration or pulse of your fistula or graft is gone.   There is new or unusual swelling of the area around the access.   There was an unsuccessful puncture of your access by the dialysis team.   The flow of blood through the fistula, graft, or catheter is too slow for effective dialysis.   When routine dialysis is completed and the needle is removed, bleeding lasts for too long a time.  WHAT HAPPENS IF MY VASCULAR ACCESS MALFUNCTIONS? Your health care provider may order blood work, cultures, or an X-ray test in order to learn what may be wrong with your vascular access. The X-ray test involves the injection of a liquid into  the vascular access. The liquid shows up on the X-ray and allows your health care provider to see if there is a blockage in the vascular access.  Treatment varies depending on the cause of the malfunction:   If the vascular access is infected, your health care provider may prescribe antibiotic medicine to control the infection.   If a clot is found in the vascular access, you may need surgery to remove the clot.   If a blockage in the vascular access is due to some other cause (such as a kink in a graft), then you will likely need surgery to unblock or replace the graft.  HOME CARE INSTRUCTIONS: Follow up with your surgeon or other health care provider if you were instructed to do so. This is very important. Any delay in follow-up could cause permanent dysfunction of the vascular access, which may be dangerous.  SEEK MEDICAL CARE IF:   Fever develops.   Swelling and pain around the vascular access gets worse or new pain develops.  Pain, numbness, or an unusual pale skin color develops in the hand on the side of your vascular access. SEEK IMMEDIATE MEDICAL CARE IF: Unusual bleeding develops at the location of the vascular access. MAKE SURE YOU:  Understand these instructions.  Will watch your condition.  Will get help right away if you are not doing well or get worse. Document Released: 06/05/2006 Document Revised: 11/17/2013  Document Reviewed: 12/05/2012 [facility] Patient Information 2015 [place], [place]. This information is not intended to replace advice given to you by your health care provider. Make sure you discuss any questions you have with your health care provider. Hyperkalemia Hyperkalemia is when you have too much potassium in your blood. This can be a life-threatening condition. Potassium is normally removed (excreted) from the body by the kidneys. CAUSES  The potassium level in your body can become too high for the following reasons:  You take in too much potassium.  You can do this by:  Using salt substitutes. They contain large amounts of potassium.  Taking potassium supplements from your caregiver. The dose may be too high for you.  Eating foods or taking nutritional products with potassium.  You excrete too little potassium. This can happen if:  Your kidneys are not functioning properly. Kidney (renal) disease is a very common cause of hyperkalemia.  You are taking medicines that lower your excretion of potassium, such as certain diuretic medicines.  You have an adrenal gland disease called Addison's disease.  You have a urinary tract obstruction, such as kidney stones.  You are on treatment to mechanically clean your blood (dialysis) and you skip a treatment.  You release a high amount of potassium from your cells into your blood. You may have a condition that causes potassium to move from your cells to your bloodstream. This can happen with:  Injury to muscles or other tissues. Most potassium is stored in the muscles.  Severe burns or infections.  Acidic blood plasma (acidosis). Acidosis can result from many diseases, such as uncontrolled diabetes. SYMPTOMS  Usually, there are no symptoms unless the potassium is dangerously high or has risen very quickly. Symptoms may include:  Irregular or very slow heartbeat.  Feeling sick to your stomach (nauseous).  Tiredness (fatigue).  Nerve problems such as tingling of the skin, numbness of the hands or feet, weakness, or paralysis. DIAGNOSIS  A simple blood test can measure the amount of potassium in your body. An electrocardiogram test of the heart can also help make the diagnosis. The heart may beat dangerously fast or slow down and stop beating with severe hyperkalemia.  TREATMENT  Treatment depends on how bad the condition is and on the underlying cause.  If the hyperkalemia is an emergency (causing heart problems or paralysis), many different medicines can be used alone or together to  lower the potassium level briefly. This may include an insulin injection even if you are not diabetic. Emergency dialysis may be needed to remove potassium from the body.  If the hyperkalemia is less severe or dangerous, the underlying cause is treated. This can include taking medicines if needed. Your prescription medicines may be changed. You may also need to take a medicine to help your body get rid of potassium. You may need to eat a diet low in potassium. HOME CARE INSTRUCTIONS   Take medicines and supplements as directed by your caregiver.  Do not take any over-the-counter medicines, supplements, natural products, herbs, or vitamins without reviewing them with your caregiver. Certain supplements and natural food products can have high amounts of potassium. Other products (such as ibuprofen) can damage weak kidneys and raise your potassium.  You may be asked to do repeat lab tests. Be sure to follow these directions.  If you have kidney disease, you may need to follow a low potassium diet. SEEK MEDICAL CARE IF:   You notice an irregular or very slow heartbeat.  You  feel lightheaded.  You develop weakness that is unusual for you. SEEK IMMEDIATE MEDICAL CARE IF:   You have shortness of breath.  You have chest discomfort.  You pass out (faint). MAKE SURE YOU:   Understand these instructions.  Will watch your condition.  Will get help right away if you are not doing well or get worse. Document Released: 06/23/2002 Document Revised: 09/25/2011 Document Reviewed: 10/08/2013 [facility] Patient Information 2015 [place], [place]. This information is not intended to replace advice given to you by your health care provider. Make sure you discuss any questions you have with your health care provider.

## 2014-08-08 NOTE — ED Notes (Signed)
Consulting MD at bedside

## 2014-08-08 NOTE — ED Notes (Signed)
Pt on dialysis MWF. Pt did not go to dialysis yesterday, reports that he was going today. Pt reports that his fistula is clotted and found out it was clotted yesterday.

## 2014-08-25 ENCOUNTER — Other Ambulatory Visit: Payer: Self-pay | Admitting: *Deleted

## 2014-08-25 DIAGNOSIS — N186 End stage renal disease: Secondary | ICD-10-CM

## 2014-08-25 DIAGNOSIS — Z01810 Encounter for preprocedural cardiovascular examination: Secondary | ICD-10-CM

## 2014-09-25 ENCOUNTER — Encounter: Payer: Self-pay | Admitting: Surgery

## 2014-09-28 ENCOUNTER — Encounter: Payer: Self-pay | Admitting: Surgery

## 2014-09-28 ENCOUNTER — Ambulatory Visit (INDEPENDENT_AMBULATORY_CARE_PROVIDER_SITE_OTHER)
Admission: RE | Admit: 2014-09-28 | Discharge: 2014-09-28 | Disposition: A | Discharge status: Home / Self Care | Payer: Medicare Other | Source: Ambulatory Visit | Attending: Surgery | Admitting: Surgery

## 2014-09-28 ENCOUNTER — Ambulatory Visit (HOSPITAL_COMMUNITY)
Admission: RE | Admit: 2014-09-28 | Discharge: 2014-09-28 | Disposition: A | Discharge status: Home / Self Care | Payer: Medicare Other | Source: Ambulatory Visit | Attending: Surgery | Admitting: Surgery

## 2014-09-28 ENCOUNTER — Ambulatory Visit (INDEPENDENT_AMBULATORY_CARE_PROVIDER_SITE_OTHER): Payer: Medicare Other | Admitting: Surgery

## 2014-09-28 VITALS — BP 146/97 | HR 103 | Ht 69.0 in | Wt 209.0 lb

## 2014-09-28 DIAGNOSIS — N186 End stage renal disease: Secondary | ICD-10-CM | POA: Insufficient documentation

## 2014-09-28 DIAGNOSIS — Z992 Dependence on renal dialysis: Secondary | ICD-10-CM | POA: Diagnosis not present

## 2014-09-28 DIAGNOSIS — Z01810 Encounter for preprocedural cardiovascular examination: Secondary | ICD-10-CM | POA: Insufficient documentation

## 2014-09-28 NOTE — Progress Notes (Signed)
History and Physical  History of Present Illness  [name] is a 44 y.o. male who presents with chief complaint: Left BC fistula occluded 6 weeks ago.  The patient presents today for He presents today for evaluation of new access.  He dialysis M-W-F currently using a right IJ catheter.  Past medical history includes MI s/p CABG, hypertension and ESRD.  He denise DM or hypercholesterolemia.   Past Medical History  Diagnosis Date  . Renal disorder   . Hypertension     Past Surgical History  Procedure Laterality Date  . Dialysis fistula creation Left   . Cardiac surgery  2007    History   Social History  . Marital Status: Married    Spouse Name: N/A  . Number of Children: N/A  . Years of Education: N/A   Occupational History  . Not on file.   Social History Main Topics  . Smoking status: Never Smoker   . Smokeless tobacco: Not on file  . Alcohol Use: No  . Drug Use: No  . Sexual Activity: Not on file   Other Topics Concern  . Not on file   Social History Narrative    No family history on file.  Current Outpatient Prescriptions on File Prior to Visit  Medication Sig Dispense Refill  . aspirin 81 MG tablet Take 81 mg by mouth daily.    . benazepril (LOTENSIN) 10 MG tablet Take 10 mg by mouth at bedtime as needed (blood pressure).   6  . calcium acetate (PHOSLO) 667 MG capsule Take 2,668 mg by mouth 3 (three) times daily with meals.     . folic acid-vitamin b complex-vitamin c-selenium-zinc (DIALYVITE) 3 MG TABS tablet Take 1 tablet by mouth daily.     No current facility-administered medications on file prior to visit.    No Known Allergies  Review of Systems:  Review of Systems  Constitutional: Negative for fever and chills.  HENT: Negative for congestion, hearing loss and nosebleeds.   Eyes: Negative for blurred vision and double vision.  Respiratory: Negative for cough and hemoptysis.   Cardiovascular: Negative for chest pain and leg swelling.    Gastrointestinal: Negative for heartburn, nausea and vomiting.  Genitourinary: Negative for dysuria.  Musculoskeletal: Negative for back pain and neck pain.  Skin: Negative for rash.  Neurological: Negative for dizziness, tingling, seizures and headaches.  Endo/Heme/Allergies: Does not bruise/bleed easily.  Psychiatric/Behavioral: Positive for suicidal ideas. Negative for depression.      Physical Examination  Filed Vitals:   09/28/14 1503  BP: 146/97  Pulse: 103  Height: 5\' 9"  (1.753 m)  Weight: 209 lb (94.802 kg)  SpO2: 100%    General: A&O x 3, WDWN Somulent  Pulmonary: Sym exp, good air movt, CTAB, no rales, rhonchi, & wheezing  Cardiac: RRR, Nl S1, S2, no Murmurs, rubs or gallops  Gastrointestinal: soft, NTND,  Musculoskeletal: M/S 5/5 throughout, Extremities without ischemic changes   Vascular: radial, brachial pulses palpable.  Non palpable thrill in left BC fistula.  Vein mapping:   shows exceptable Cephalic veins bilaterally Arterial duplex Triphasic flow right arm tested today  Medical Decision Making  [name] is a 44 y.o. male who presents with: . ESRD who needs new acces for dialysis  We discussed that both arm have acceptable cephalic veins.  He wishes to use the left since he is right hand dominant ant this is where he had previous access before.  Dr. Myra GianottiBrabham plans on first stage  basilic vein transposition creation in April 2016 when the patient calls for scheduling.  He was seen today in clinic by Dr. Myra Gianotti as well   [name], [name] [facility] [place] Office: [phone]   09/28/2014, 3:49 PM  I agree with the above.  The patient has an adequate basilic vein on the left.  He will be scheduled for a first stage left basilic vein transposition.  He will contact me for a date in April or May.  He understands that this is a 2 stage procedure.  Durene Cal

## 2015-03-29 NOTE — Progress Notes (Signed)
Multiple attempts have been made to contact the pt. re: rescheduling appt. with Dr. Myra Gianotti, to discuss scheduling surgery for permanent hemodialysis access.  Left voice message with pt. and wife on several dates, to call office when ready to reschedule appt.

## 2017-10-24 ENCOUNTER — Encounter (HOSPITAL_COMMUNITY): Payer: Self-pay

## 2017-10-24 ENCOUNTER — Other Ambulatory Visit: Payer: Self-pay

## 2017-10-24 ENCOUNTER — Emergency Department (HOSPITAL_COMMUNITY): Payer: No Typology Code available for payment source

## 2017-10-24 ENCOUNTER — Emergency Department (HOSPITAL_COMMUNITY)
Admission: EM | Admit: 2017-10-24 | Discharge: 2017-10-24 | Disposition: A | Discharge status: Home / Self Care | Payer: No Typology Code available for payment source | Attending: Emergency Medicine | Admitting: Emergency Medicine

## 2017-10-24 DIAGNOSIS — S61411A Laceration without foreign body of right hand, initial encounter: Secondary | ICD-10-CM | POA: Insufficient documentation

## 2017-10-24 DIAGNOSIS — Z7982 Long term (current) use of aspirin: Secondary | ICD-10-CM | POA: Insufficient documentation

## 2017-10-24 DIAGNOSIS — I10 Essential (primary) hypertension: Secondary | ICD-10-CM | POA: Diagnosis not present

## 2017-10-24 DIAGNOSIS — S6991XA Unspecified injury of right wrist, hand and finger(s), initial encounter: Secondary | ICD-10-CM | POA: Diagnosis present

## 2017-10-24 DIAGNOSIS — Y999 Unspecified external cause status: Secondary | ICD-10-CM | POA: Insufficient documentation

## 2017-10-24 DIAGNOSIS — Y939 Activity, unspecified: Secondary | ICD-10-CM | POA: Insufficient documentation

## 2017-10-24 DIAGNOSIS — Z23 Encounter for immunization: Secondary | ICD-10-CM | POA: Diagnosis not present

## 2017-10-24 DIAGNOSIS — Y929 Unspecified place or not applicable: Secondary | ICD-10-CM | POA: Insufficient documentation
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
## 2017-10-24 MED ORDER — TRAMADOL HCL 50 MG PO TABS: 50.0000 mg | ORAL_TABLET | Freq: Four times a day (QID) | ORAL | 0 refills | Status: AC | PRN

## 2017-10-24 MED ORDER — TETANUS-DIPHTH-ACELL PERTUSSIS 5-2.5-18.5 LF-MCG/0.5 IM SUSP
0.5000 mL | Freq: Once | INTRAMUSCULAR | Status: AC
  Administered 2017-10-24: 0.5 mL via INTRAMUSCULAR
  Filled 2017-10-24: qty 0.5

## 2017-10-24 MED ORDER — HYDROCODONE-ACETAMINOPHEN 5-325 MG PO TABS
1.0000 | ORAL_TABLET | Freq: Once | ORAL | Status: AC
  Administered 2017-10-24: 1 via ORAL
  Filled 2017-10-24: qty 1

## 2017-10-24 MED ORDER — LIDOCAINE HCL (PF) 1 % IJ SOLN
10.0000 mL | Freq: Once | INTRAMUSCULAR | Status: AC
  Administered 2017-10-24: 10 mL
  Filled 2017-10-24: qty 30

## 2017-10-24 MED ORDER — CEPHALEXIN 500 MG PO CAPS: 500.0000 mg | ORAL_CAPSULE | Freq: Four times a day (QID) | ORAL | 0 refills | Status: AC

## 2017-10-24 NOTE — Discharge Instructions (Addendum)
Please read and follow all provided instructions.  Your diagnoses today is a laceration. A laceration is a cut or lesion that goes through all layers of the skin and into the tissue just beneath the skin. This was repaired with 13 stitches or a tissue adhesive similar to a super glue.  Follow up with your doctor, an urgent care, or this Emergency Department for removal of your stitches in 7-10 days. Keep the wound clean and dry for the next 24 hours and leave the dressing in place. You may shower after 24 hours. Do not soak the area for long periods of times as in a bath until the sutures are removed. After 24 hours you may remove the dressing and gently clean the laceration site with antibacterial soap (i.e. Neosporin or Bacitracin) and warm water 2 times a day. Pat dry with clean towel. Do not scrub. Once the wound has healed, scarring can be minimized by covering the wound with sunscreen during the day for 1 full year.  Wear splint to avoid rupturing of sutures that could happen with excessive movement of thumb.  Use Ultram for breakthrough pain. This is a narcotic. Do not drive or drink alcohol while taking this medication.   Return instructions:  You have redness, swelling, or increasing pain in the wound.  You see a red line that goes away from the wound.  You have yellowish-white fluid (pus) coming from the wound.  You have a fever (above 100.63F) You notice a bad smell coming from the wound or dressing.  Your wound breaks open before or after sutures have been removed.  You notice something coming out of the wound such as wood or glass.  Your wound is on your hand or foot and you cannot move a finger or toe.  Your pain is not controlled with prescribed medicine.     Additional Information:  If you did not receive a tetanus shot today because you thought you were up to date, but did not recall when your last one was given, make sure to check with your primary caregiver to determine if  you need one.   Your vital signs today were: BP 138/78 (BP Location: Right Arm)    Pulse 88    Temp 97.7 F (36.5 C) (Oral)    Resp 18    SpO2 100%  If your blood pressure (BP) was elevated above 135/85 this visit, please have this repeated by your doctor within one month.
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
## 2018-06-07 ENCOUNTER — Other Ambulatory Visit: Payer: Self-pay

## 2018-06-07 ENCOUNTER — Encounter (HOSPITAL_COMMUNITY): Payer: Self-pay

## 2018-06-07 ENCOUNTER — Emergency Department (HOSPITAL_COMMUNITY)
Admission: EM | Admit: 2018-06-07 | Discharge: 2018-06-08 | Disposition: A | Discharge status: Other Acute Inpatient Hospital | Payer: Medicare Other | Attending: Emergency Medicine | Admitting: Emergency Medicine

## 2018-06-07 ENCOUNTER — Emergency Department (HOSPITAL_COMMUNITY): Payer: Medicare Other

## 2018-06-07 DIAGNOSIS — I713 Abdominal aortic aneurysm, ruptured, unspecified: Secondary | ICD-10-CM

## 2018-06-07 DIAGNOSIS — I129 Hypertensive chronic kidney disease with stage 1 through stage 4 chronic kidney disease, or unspecified chronic kidney disease: Secondary | ICD-10-CM | POA: Diagnosis not present

## 2018-06-07 DIAGNOSIS — I7102 Dissection of abdominal aorta: Secondary | ICD-10-CM | POA: Insufficient documentation

## 2018-06-07 DIAGNOSIS — M545 Low back pain: Secondary | ICD-10-CM | POA: Diagnosis not present

## 2018-06-07 DIAGNOSIS — Z992 Dependence on renal dialysis: Secondary | ICD-10-CM | POA: Insufficient documentation

## 2018-06-07 DIAGNOSIS — M549 Dorsalgia, unspecified: Secondary | ICD-10-CM

## 2018-06-07 DIAGNOSIS — Z79899 Other long term (current) drug therapy: Secondary | ICD-10-CM | POA: Diagnosis not present

## 2018-06-07 DIAGNOSIS — N189 Chronic kidney disease, unspecified: Secondary | ICD-10-CM | POA: Diagnosis not present

## 2018-06-07 LAB — COMPREHENSIVE METABOLIC PANEL
ALT: 27 U/L (ref 0–44)
AST: 14 U/L — AB (ref 15–41)
Albumin: 3 g/dL — ABNORMAL LOW (ref 3.5–5.0)
Alkaline Phosphatase: 57 U/L (ref 38–126)
Anion gap: 18 — ABNORMAL HIGH (ref 5–15)
BUN: 95 mg/dL — AB (ref 6–20)
CHLORIDE: 98 mmol/L (ref 98–111)
CO2: 20 mmol/L — AB (ref 22–32)
CREATININE: 14.33 mg/dL — AB (ref 0.61–1.24)
Calcium: 9.5 mg/dL (ref 8.9–10.3)
GFR calc non Af Amer: 3 mL/min — ABNORMAL LOW (ref >60)
GFR, EST AFRICAN AMERICAN: 4 mL/min — AB (ref >60)
Glucose, Bld: 70 mg/dL (ref 70–99)
Potassium: 6.1 mmol/L — ABNORMAL HIGH (ref 3.5–5.1)
SODIUM: 136 mmol/L (ref 135–145)
Total Bilirubin: 0.9 mg/dL (ref 0.3–1.2)
Total Protein: 6.7 g/dL (ref 6.5–8.1)

## 2018-06-07 LAB — CBC WITH DIFFERENTIAL/PLATELET
ABS IMMATURE GRANULOCYTES: 0.03 10*3/uL (ref 0.00–0.07)
BASOS ABS: 0 10*3/uL (ref 0.0–0.1)
Basophils Relative: 0 %
EOS PCT: 0 %
Eosinophils Absolute: 0 10*3/uL (ref 0.0–0.5)
HCT: 35.6 % — ABNORMAL LOW (ref 39.0–52.0)
HEMOGLOBIN: 10.7 g/dL — AB (ref 13.0–17.0)
Immature Granulocytes: 0 %
LYMPHS ABS: 0.4 10*3/uL — AB (ref 0.7–4.0)
LYMPHS PCT: 4 %
MCH: 26.4 pg (ref 26.0–34.0)
MCHC: 30.1 g/dL (ref 30.0–36.0)
MCV: 87.9 fL (ref 80.0–100.0)
Monocytes Absolute: 1 10*3/uL (ref 0.1–1.0)
Monocytes Relative: 10 %
NRBC: 0 % (ref 0.0–0.2)
Neutro Abs: 8.2 10*3/uL — ABNORMAL HIGH (ref 1.7–7.7)
Neutrophils Relative %: 86 %
Platelets: 331 10*3/uL (ref 150–400)
RBC: 4.05 MIL/uL — ABNORMAL LOW (ref 4.22–5.81)
RDW: 16.9 % — ABNORMAL HIGH (ref 11.5–15.5)
WBC: 9.7 10*3/uL (ref 4.0–10.5)

## 2018-06-07 LAB — TYPE AND SCREEN
ABO/RH(D): B POS
ANTIBODY SCREEN: NEGATIVE

## 2018-06-07 MED ORDER — IOPAMIDOL (ISOVUE-370) INJECTION 76%: 100.0000 mL | Freq: Once | INTRAVENOUS | Status: DC | PRN

## 2018-06-07 MED ORDER — IOPAMIDOL (ISOVUE-370) INJECTION 76%
INTRAVENOUS | Status: AC
  Filled 2018-06-07: qty 100

## 2018-06-07 MED ORDER — SODIUM ZIRCONIUM CYCLOSILICATE 10 G PO PACK
10.0000 g | PACK | Freq: Once | ORAL | Status: AC
  Administered 2018-06-08: 10 g via ORAL
  Filled 2018-06-07: qty 1

## 2018-06-07 MED ORDER — HYDROCODONE-ACETAMINOPHEN 5-325 MG PO TABS
1.0000 | ORAL_TABLET | Freq: Once | ORAL | Status: AC
  Administered 2018-06-07: 1 via ORAL
  Filled 2018-06-07: qty 1

## 2018-06-07 MED ORDER — IOPAMIDOL (ISOVUE-370) INJECTION 76%
100.0000 mL | Freq: Once | INTRAVENOUS | Status: AC | PRN
  Administered 2018-06-07: 100 mL via INTRAVENOUS

## 2018-06-07 MED ORDER — MORPHINE SULFATE (PF) 4 MG/ML IV SOLN
4.0000 mg | Freq: Once | INTRAVENOUS | Status: AC
  Administered 2018-06-08: 4 mg via INTRAVENOUS
  Filled 2018-06-07 (×2): qty 1

## 2018-06-07 MED ORDER — METOPROLOL TARTRATE 5 MG/5ML IV SOLN
5.0000 mg | Freq: Once | INTRAVENOUS | Status: DC
  Filled 2018-06-07: qty 5

## 2018-06-07 NOTE — ED Provider Notes (Signed)
[facility] EMERGENCY DEPARTMENT Provider Note   CSN: [id number] Arrival date & time: 06/07/18  1549     History   Chief Complaint Chief Complaint  Patient presents with  . Back Pain    HPI [name] is a 47 y.o. male history of hypertension, chronic kidney disease (Monday, Wednesday, Friday dialysis patient) who presents for evaluation of lower back pain that is been ongoing for last 1 to 2 months.  He denies any preceding trauma, injury, fall.  He states that the pain is intermittent.  He states that when he has it, pain is sharp in the lower back that he also feels muscle spasms.  He states that his kidney doctor gave him some pain medication and Flexeril.  States he is also seeing a [facility] for pain.  He reports that pain over the last 2 days was better but then states today he returned.  He states it is worse with bending and he has to find a good position to make him more comfortable.  He states that he intermittently makes urine and has not had any dysuria or hematuria.  Patient denies any difficulty walking.  He has not had any numbness/weakness of his arms or legs.  Patient states that he has not tried any other medications for the pain. Denies fevers, weight loss, numbness/weakness of upper and lower extremities, bowel/bladder incontinence, saddle anesthesia, history of back surgery, history of IVDA.   The history is provided by the patient.    Past Medical History:  Diagnosis Date  . Hypertension   . Renal disorder     There are no active problems to display for this patient.   Past Surgical History:  Procedure Laterality Date  . CARDIAC SURGERY  2007  . DIALYSIS FISTULA CREATION Left         Home Medications    Prior to Admission medications   Medication Sig Start Date End Date Taking? Authorizing Provider  benazepril (LOTENSIN) 10 MG tablet Take 10 mg by mouth at bedtime as needed (blood pressure).  06/22/14  Yes [provider]  calcium acetate (PHOSLO) 667 MG capsule Take 2,668 mg by mouth 3 (three) times daily with meals.    Yes [provider]  folic acid-vitamin b complex-vitamin c-selenium-zinc (DIALYVITE) 3 MG TABS tablet Take 1 tablet by mouth daily.   Yes [provider]  methocarbamol (ROBAXIN) 500 MG tablet Take 500 mg by mouth 2 (two) times daily as needed. 05/23/18  Yes [provider]  traMADol (ULTRAM) 50 MG tablet Take 1 tablet (50 mg total) by mouth every 6 (six) hours as needed. 10/24/17  Yes Maczis, Elmer Sow, PA-C  cephALEXin (KEFLEX) 500 MG capsule Take 1 capsule (500 mg total) by mouth 4 (four) times daily. Patient not taking: Reported on 06/07/2018 10/24/17   Maczis, Elmer Sow, PA-C    Family History No family history on file.  Social History Social History   Tobacco Use  . Smoking status: Never Smoker  Substance Use Topics  . Alcohol use: No  . Drug use: No     Allergies   Patient has no known allergies.   Review of Systems Review of Systems  Constitutional: Negative for fever.  Respiratory: Negative for cough and shortness of breath.   Cardiovascular: Negative for chest pain.  Gastrointestinal: Negative for abdominal pain, nausea and vomiting.  Genitourinary: Negative for dysuria and hematuria.  Musculoskeletal: Positive for back pain.  Neurological: Negative for weakness, numbness and headaches.  All other systems reviewed and are negative.    Physical Exam Updated Vital Signs BP (!) 130/96   Pulse (!) 110   Temp 97.9 F (36.6 C) (Oral)   Resp (!) 25   SpO2 98%   Physical Exam  Constitutional: He is oriented to person, place, and time. He appears well-developed and well-nourished.  HENT:  Head: Normocephalic and atraumatic.  Mouth/Throat: Oropharynx is clear and moist and mucous membranes are normal.  Eyes: Pupils are equal, round, and reactive to light. Conjunctivae, EOM and lids are normal.  Neck: Full passive range of motion  without pain.  Full flexion/extension and lateral movement of neck fully intact. No bony midline tenderness. No deformities or crepitus.   Cardiovascular: Normal rate, regular rhythm, normal heart sounds and normal pulses. Exam reveals no gallop and no friction rub.  No murmur heard. Pulses:      Radial pulses are 2+ on the right side, and 2+ on the left side.       Dorsalis pedis pulses are 2+ on the right side, and 2+ on the left side.  Pulmonary/Chest: Effort normal and breath sounds normal.  Ports noted to right anterior chest wall.  No surrounding warmth, erythema, edema.  Abdominal: Soft. Normal appearance. There is no tenderness. There is no rigidity and no guarding.  Abdomen is soft, non-distended, non-tender. No rigidity, No guarding. No peritoneal signs.  Musculoskeletal: Normal range of motion.       Thoracic back: He exhibits no tenderness.       Back:  Neurological: He is alert and oriented to person, place, and time.  Follows commands, Moves all extremities  5/5 strength to BUE and BLE  Sensation intact throughout all major nerve distributions  Skin: Skin is warm and dry. Capillary refill takes less than 2 seconds.  Psychiatric: He has a normal mood and affect. His speech is normal.  Nursing note and vitals reviewed.    ED Treatments / Results  Labs (all labs ordered are listed, but only abnormal results are displayed) Labs Reviewed  COMPREHENSIVE METABOLIC PANEL - Abnormal; Notable for the following components:      Result Value   Potassium 6.1 (*)    CO2 20 (*)    BUN 95 (*)    Creatinine, Ser 14.33 (*)    Albumin 3.0 (*)    AST 14 (*)    GFR calc non Af Amer 3 (*)    GFR calc Af Amer 4 (*)    Anion gap 18 (*)    All other components within normal limits  CBC WITH DIFFERENTIAL/PLATELET - Abnormal; Notable for the following components:   RBC 4.05 (*)    Hemoglobin 10.7 (*)    HCT 35.6 (*)    RDW 16.9 (*)    Neutro Abs 8.2 (*)    Lymphs Abs 0.4 (*)    All  other components within normal limits  URINALYSIS, ROUTINE W REFLEX MICROSCOPIC  TYPE AND SCREEN    EKG EKG Interpretation  Date/Time:  Friday June 07 2018 21:54:32 EST Ventricular Rate:  107 PR Interval:    QRS Duration: 121 QT Interval:  384 QTC Calculation: 513 R Axis:   -4 Text Interpretation:  Sinus or ectopic atrial tachycardia Left atrial enlargement Nonspecific intraventricular conduction delay Abnormal T, consider ischemia, lateral leads No significant change since last tracing Confirmed by Linwood Dibbles [phone]) on 06/07/2018 10:54:17 PM   Radiology Ct L-spine No Charge  Result Date: 06/07/2018 CLINICAL DATA:  Constant sharp  back pain for month. History of end-stage renal disease on dialysis, aortic dissection. EXAM: CT LUMBAR SPINE WITHOUT CONTRAST TECHNIQUE: Reformatted CT imaging of the lumbar spine was performed from concurrent CT angiogram chest, abdomen and pelvis. Multiplanar CT image reconstructions were also generated. COMPARISON:  None. FINDINGS: SEGMENTATION: For the purposes of this report the last well-formed intervertebral disc space is reported as L5-S1. ALIGNMENT: Maintained lumbar lordosis. No malalignment. VERTEBRAE: Vertebral bodies and posterior elements are intact. Mild erosion anterior L1 due to large aortic aneurysm. Intervertebral disc heights preserved. L5-S1 Schmorl's nodes. PARASPINAL AND OTHER SOFT TISSUES: Large abdominal aortic aneurysm contiguous with anterior longitudinal ligament from T11-12 through L2. enlarged LEFT iliopsoas muscle concerning for aneurysmal extension. DISC LEVELS: L1-2 through L4-5: No disc bulge, canal stenosis nor neural foraminal narrowing. L5-S1: Small broad-based disc bulge without canal stenosis or neural foraminal narrowing. IMPRESSION: 1. Large abdominal aortic aneurysm (please see CTA from same day, reported separately for further description); aneurysm has eroded the anterior aspect of vertebral body L1 and presumably  extend into LEFT psoas muscle. 2. No fracture or malalignment. No osseous canal stenosis or neural foraminal narrowing. Electronically Signed   By: Awilda Metro M.D.   On: 06/07/2018 21:46   Ct Angio Chest/abd/pel For Dissection W And/or Wo Contrast  Result Date: 06/07/2018 CLINICAL DATA:  47 year old male with persistent constant sharp lower back pain for 1 month. EXAM: CT ANGIOGRAPHY CHEST, ABDOMEN AND PELVIS TECHNIQUE: Multidetector CT imaging through the chest, abdomen and pelvis was performed using the standard protocol during bolus administration of intravenous contrast. Multiplanar reconstructed images and MIPs were obtained and reviewed to evaluate the vascular anatomy. CONTRAST:  ISOVUE-370 IOPAMIDOL (ISOVUE-370) INJECTION 76% COMPARISON:  None. FINDINGS: CTA CHEST FINDINGS Cardiovascular: Heart size is mildly enlarged. There is no significant pericardial fluid, thickening or pericardial calcification. There is aortic atherosclerosis, as well as atherosclerosis of the great vessels of the mediastinum and the coronary arteries, including calcified atherosclerotic plaque in the left main, left anterior descending, left circumflex and right coronary arteries. Status post median sternotomy for graft repair of the ascending thoracic aorta. It appears that the great vessels have also been transplanted on of the graft arising from a common origin. The graft appears to extend to the mid aortic arch, beyond which there is a chronic dissection of the thoracic aorta. The false lumen appears filled with chronic thrombus in the distal aspect of the arch and proximal descending thoracic aorta, but subsequently opacifies with contrast in the distal descending thoracic aorta. The distal descending thoracic aorta is markedly aneurysmal at the level of the aortic hiatus measuring up to 8.2 x 10.9 cm (axial image 149 of series 4). At this level, there is heterogeneous high attenuation along the posterior  aspect of this aneurysm sac on precontrast images, which likely reflects some coagulated blood products. Notably, the intimal calcifications along the posterior wall of the aorta appeared completely disrupted throughout this region, and this nonenhancing material in the aneurysm sac appears to extend into the adjacent soft tissues (as well as the anterior aspect of L1 vertebral body in the left psoas musculature), likely to reflect a contained rupture. 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 Right internal jugular PermCath with tip terminating in the right atrium. Mediastinum/Nodes: No pathologically enlarged mediastinal or hilar lymph nodes. Esophagus  is unremarkable in appearance. No axillary lymphadenopathy. Lungs/Pleura: Small low-attenuation left pleural effusion lying dependently. Ill-defined opacities in the extreme lung bases, favored to be atelectatic. No definite consolidative airspace disease. No suspicious appearing pulmonary nodules or masses. Musculoskeletal: There are no aggressive appearing lytic or blastic lesions noted in the visualized portions of the skeleton. Review of the MIP images confirms the above findings. CTA ABDOMEN AND PELVIS FINDINGS VASCULAR Aorta: Aneurysm/dissection of abdominal aorta (continuation from thoracic aorta), which continues nearly to the level of the aortic bifurcation. The suprarenal abdominal aorta is greatest in dimension at and immediately below the level of the diaphragm (see discussion of distal descending thoracic aorta above). As detailed above, this appears to reflect a contained rupture with some erosion into the anterior aspect of the L1 vertebral body, as well as extension into the left psoas muscle. There is also trace amount of para-aortic intermediate attenuation fluid  antral laterally (axial image 65 of series 3, which could reflect a small amount of older periaortic hemorrhage. Celiac: Patent and arising from the true lumen. SMA: Patent and arising from the true lumen. Renals: Patent and arising from the true lumen. IMA: Patent and arising from the false lumen. 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 Veins: No obvious venous abnormality within the limitations of this arterial phase study. Review of the MIP images confirms the above findings. NON-VASCULAR Hepatobiliary: Probable benign perfusion anomalies in the left lobe of the liver incidentally noted. No definite suspicious cystic or solid hepatic lesions. No intra or extrahepatic biliary ductal dilatation. Gallbladder is unremarkable in appearance. Pancreas: No pancreatic mass. No pancreatic ductal dilatation. No pancreatic or peripancreatic fluid or inflammatory changes. Spleen: Unremarkable. Adrenals/Urinary Tract: Polycystic kidneys bilaterally with innumerable complex cysts. No hydroureteronephrosis. Urinary bladder is nearly completely decompressed, but otherwise unremarkable in appearance. Stomach/Bowel: Normal appearance of the stomach. No pathologic dilatation of small bowel or colon. A few scattered colonic diverticulae are noted, without surrounding inflammatory changes to suggest an acute diverticulitis at this time. Normal appendix. Lymphatic: No lymphadenopathy noted in the abdomen or pelvis. Reproductive: Prostate gland and seminal vesicles are unremarkable in appearance. Other: Small amount of ascites most evident in the perihepatic regions, pericolic gutters and low anatomic pelvis. No pneumoperitoneum. Musculoskeletal: Partial erosion of the anterior aspect of the L1 vertebral body in the midline and slightly to the left of midline, presumably related to the adjacent aortic aneurysm/dissection. Review of the MIP images confirms the above findings. IMPRESSION: 1. There appears  to be a surgically corrected type 1 thoracic aortic dissection, with extension of the dissection into the abdomen nearly to the level of the aortic bifurcation. The descending thoracic aorta and suprarenal abdominal aorta is markedly aneurysmal measuring up to 8.2 x 10.9 cm. As detailed above, there is evidence of probable contained rupture in the posterior mediastinum and retroperitoneum at and immediately below the level of the aortic hiatus, with evidence of the aneurysm eroding into the anterior aspect of the L1 vertebral body as well as the left psoas musculature. 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 Emergent surgical consultation is strongly recommended. 2. 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 3. Small left pleural effusion. 4. Trace volume of ascites. 5. Stigmata of polycystic kidney disease. 6. Additional findings, as above. Critical Value/emergent results were called by telephone at the time of interpretation on 06/07/2018 at 9:44 pm to [facility]  Launi Asencio, who verbally acknowledged these results. Electronically Signed   By: Trudie Reed M.D.   On: 06/07/2018 22:05    Procedures .Critical Care Performed by: Maxwell Caul, PA-C Authorized by: Maxwell Caul, PA-C   Critical care provider statement:    Critical care time (minutes):  60   Critical care was necessary to treat or prevent imminent or life-threatening deterioration of the following conditions:  Cardiac failure, respiratory failure and circulatory failure   Critical care was time spent personally by me on the following activities:  Discussions with consultants, evaluation of patient's response to treatment, examination of patient, ordering and performing treatments and interventions, ordering and review of laboratory studies, ordering and review of radiographic studies, pulse oximetry, re-evaluation of patient's  condition, obtaining history from patient or surrogate and review of old charts   (including critical care time)  Medications Ordered in ED Medications  morphine 4 MG/ML injection 4 mg (0 mg Intravenous Hold 06/07/18 1816)  iopamidol (ISOVUE-370) 76 % injection (has no administration in time range)  iopamidol (ISOVUE-370) 76 % injection 100 mL (has no administration in time range)  metoprolol tartrate (LOPRESSOR) injection 5 mg (0 mg Intravenous Hold 06/07/18 2252)  sodium zirconium cyclosilicate (LOKELMA) packet 10 g (has no administration in time range)  HYDROcodone-acetaminophen (NORCO/VICODIN) 5-325 MG per tablet 1 tablet (1 tablet Oral Given 06/07/18 1734)  iopamidol (ISOVUE-370) 76 % injection 100 mL (100 mLs Intravenous Contrast Given 06/07/18 2113)     Initial Impression / Assessment and Plan / ED Course  I have reviewed the triage vital signs and the nursing notes.  Pertinent labs & imaging results that were available during my care of the patient were reviewed by me and considered in my medical decision making (see chart for details).     47 year old male possible history of thoracic aortic dissection (2008), dialysis who presents for evaluation of low back pain.  He reports his back pain is been ongoing for the last 1 to 2 months.  He states that it would intermittently get better and then returned.  He reports her last few days, it got progressively worse, vomiting ED visit today.  No chest pain, shortness of breath, numbness/weakness. Patient is afebrile, non-toxic appearing, sitting comfortably on examination table. Vitals reviewed.  He is slightly tachycardic and hypertensive.  On exam, he has diffuse tenderness noted to the lower lumbar region.  No abdominal tenderness.  Equal pulses throughout all extremities.  Consider musculoskeletal etiology versus infectious etiology.  Additionally, given patient's history of dissection, also concern for aortic dissection.   CMP shows  bicarb of 20, potassium of 6.1.  BUN is 95 and creatinine is 14.33.  CBC shows no leukocytosis.  Hemoglobin is 10.7, hematocrit 35.6.  Patient reviewed on PMP.  He had a recent terminal prescription done on 04/28/2018.  CT abdomen and pelvis reviewed.  Confirms repair of his thoracic aortic dissection.  There is extension of the dissection into the abdomen nearly to the level of the aortic bifurcation.  He has a suprarenal abdominal aorta aneurysm measuring 8 x 10 cm.  There is evidence of a probable contained rupture in the posterior mediastinum and retroperitoneum and immediate below the level of the aortic hiatus.  Additionally there is evidence of the aneurysm eroding into the anterior aspect of the L1 vertebra as well as the left psoas musculature.  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  There is chronic thoracic dissection noted that extends into the abdomen.  There is aneurysm/dissection of  abdominal aorta continuation from thoracic aorta which continued continues nearly to the level of the aortic bifurcation.  There is also some contained rupture with some erosion into the anterior aspect of the L1 vertebral body as well as into the extension of the left psoas muscle.  Trace amount of periaortic attenuation fluid laterally which could reflect a small amount of periaortic hemorrhage.  Discussed patient with Dr. Myra Gianotti (Vascular) after evaluation of patient's CT scans. He he feels that patient needs a more advanced care that can be provided at this facility.  He he recommends that patient be transferred to [facility].  He will contact them and see vascular for consultation.  Discussed with Dr. Myra Gianotti (Vascular).  [facility] vascular has accepted patient for transfer.  They request a copy of the CT scan. Dr. Gayla Doss will accept.   Updated  patient on plan.  He is agreeable.  Patient refused his Lopressor here in ED.  We will give him Lokalma for hyperkalemia.   Final Clinical Impressions(s) / ED Diagnoses    Final diagnoses:  Back pain  Dissection of abdominal aorta (HCC)  Ruptured abdominal aortic aneurysm (AAA) [facility])    ED Discharge Orders    None       Rosana Hoes 06/07/18 2349    Linwood Dibbles, MD 06/08/18 0006

## 2018-06-07 NOTE — ED Notes (Signed)
Pt returned from CT °

## 2018-06-07 NOTE — ED Notes (Signed)
Pt reports taking tramadol and muscle relaxers without relief.

## 2018-06-07 NOTE — ED Notes (Signed)
Pt placed on cardiac monitor and ecg obtained. Family remains at the bedside.

## 2018-06-07 NOTE — ED Notes (Signed)
IV team at bedside 

## 2018-06-07 NOTE — ED Provider Notes (Signed)
Medical screening examination/treatment/procedure(s) were conducted as a shared visit with non-physician practitioner(s) and myself.  I personally evaluated the patient during the encounter.  EKG Interpretation  Date/Time:  Friday June 07 2018 21:54:32 EST Ventricular Rate:  107 PR Interval:    QRS Duration: 121 QT Interval:  384 QTC Calculation: 513 R Axis:   -4 Text Interpretation:  Sinus or ectopic atrial tachycardia Left atrial enlargement Nonspecific intraventricular conduction delay Abnormal T, consider ischemia, lateral leads No significant change since last tracing Confirmed by Linwood DibblesKnapp, Jamarea Selner [phone]([id number]) on 06/07/2018 10:54:17 PM  Pt presented to the ED for back pain.   Complex medical history with renal failure and history of thoracic aneurysm.  CT scan shows a large abdominal aortic aneurysm with probable contained rupture.  Vascular surgery consulted.  [facility]   Linwood DibblesKnapp, Alverta Caccamo, MD 06/07/18 2258

## 2018-06-07 NOTE — Consult Note (Signed)
[facility]  I spoke with Attending on call, Dr. Wardell HeathPascarellla, who  has accepted patient.  We will get copies of his scan prior to transfer.     [name] Teisha Trowbridge

## 2018-06-07 NOTE — ED Notes (Signed)
IV attempt unsuccessful.   IV team consulted.  

## 2018-06-07 NOTE — ED Notes (Signed)
Pt aware of need for urine specimen. Pt states he does not make much urine.

## 2018-06-07 NOTE — ED Triage Notes (Signed)
Pt reports constant, sharp lower back pain for 1 month. Dialysis pt MWF no complications with schedule.

## 2018-06-08 DIAGNOSIS — M545 Low back pain: Secondary | ICD-10-CM | POA: Diagnosis not present

## 2018-06-08 NOTE — ED Notes (Signed)
[id number]--Accepting is Dr Thora LanceLuigi Pascarella--

## 2018-06-08 NOTE — ED Notes (Signed)
Pt unable to sign EMTALA documentation due to computer and signature pad not being linked.

## 2018-06-11 MED ORDER — POLYETHYLENE GLYCOL 3350 17 G PO PACK
17.00 | PACK | ORAL | Status: DC
Start: 2018-06-10 — End: 2018-06-11

## 2018-06-11 MED ORDER — ONDANSETRON 4 MG PO TBDP
4.00 | ORAL_TABLET | ORAL | Status: DC
Start: ? — End: 2018-06-11

## 2018-06-11 MED ORDER — HYDRALAZINE HCL 20 MG/ML IJ SOLN
10.00 | INTRAMUSCULAR | Status: DC
Start: ? — End: 2018-06-11

## 2018-06-11 MED ORDER — ACETAMINOPHEN 325 MG PO TABS
650.00 | ORAL_TABLET | ORAL | Status: DC
Start: ? — End: 2018-06-11

## 2018-06-11 MED ORDER — TRAMADOL HCL 50 MG PO TABS
50.00 | ORAL_TABLET | ORAL | Status: DC
Start: ? — End: 2018-06-11

## 2018-06-11 MED ORDER — METOPROLOL TARTRATE 25 MG PO TABS
25.00 | ORAL_TABLET | ORAL | Status: DC
Start: 2018-06-09 — End: 2018-06-11

## 2018-06-11 MED ORDER — LABETALOL HCL 5 MG/ML IV SOLN
20.00 | INTRAVENOUS | Status: DC
Start: ? — End: 2018-06-11

## 2018-06-11 MED ORDER — SODIUM CHLORIDE FLUSH 0.9 % IV SOLN
3.00 | INTRAVENOUS | Status: DC
Start: 2018-06-09 — End: 2018-06-11

## 2018-06-11 MED ORDER — CALCIUM ACETATE (PHOS BINDER) 667 MG PO CAPS
2001.00 | ORAL_CAPSULE | ORAL | Status: DC
Start: 2018-06-10 — End: 2018-06-11

## 2018-06-11 MED ORDER — GENERIC EXTERNAL MEDICATION
1.30 | Status: DC
Start: ? — End: 2018-06-11

## 2018-06-11 MED ORDER — SODIUM CHLORIDE FLUSH 0.9 % IV SOLN
3.00 | INTRAVENOUS | Status: DC
Start: 2018-06-10 — End: 2018-06-11

## 2018-06-14 ENCOUNTER — Encounter (HOSPITAL_COMMUNITY): Payer: Self-pay | Admitting: Internal Medicine

## 2018-06-14 ENCOUNTER — Inpatient Hospital Stay (HOSPITAL_COMMUNITY)
Admission: EM | Admit: 2018-06-14 | Discharge: 2018-07-17 | DRG: 219 | Disposition: E | Payer: Medicare Other | Attending: Vascular Surgery | Admitting: Vascular Surgery

## 2018-06-14 ENCOUNTER — Emergency Department (HOSPITAL_COMMUNITY): Payer: Medicare Other

## 2018-06-14 ENCOUNTER — Inpatient Hospital Stay (HOSPITAL_COMMUNITY): Payer: Medicare Other

## 2018-06-14 ENCOUNTER — Encounter (HOSPITAL_COMMUNITY): Admission: EM | Disposition: E | Payer: Self-pay | Source: Home / Self Care | Attending: Vascular Surgery

## 2018-06-14 ENCOUNTER — Inpatient Hospital Stay (HOSPITAL_COMMUNITY): Payer: Medicare Other | Admitting: Certified Registered Nurse Anesthetist

## 2018-06-14 DIAGNOSIS — I715 Thoracoabdominal aortic aneurysm, ruptured: Secondary | ICD-10-CM | POA: Diagnosis present

## 2018-06-14 DIAGNOSIS — N186 End stage renal disease: Secondary | ICD-10-CM | POA: Diagnosis not present

## 2018-06-14 DIAGNOSIS — I70209 Unspecified atherosclerosis of native arteries of extremities, unspecified extremity: Secondary | ICD-10-CM | POA: Diagnosis present

## 2018-06-14 DIAGNOSIS — Z452 Encounter for adjustment and management of vascular access device: Secondary | ICD-10-CM

## 2018-06-14 DIAGNOSIS — J9600 Acute respiratory failure, unspecified whether with hypoxia or hypercapnia: Secondary | ICD-10-CM | POA: Diagnosis not present

## 2018-06-14 DIAGNOSIS — E872 Acidosis: Secondary | ICD-10-CM | POA: Diagnosis present

## 2018-06-14 DIAGNOSIS — R1909 Other intra-abdominal and pelvic swelling, mass and lump: Secondary | ICD-10-CM | POA: Diagnosis present

## 2018-06-14 DIAGNOSIS — Z66 Do not resuscitate: Secondary | ICD-10-CM | POA: Diagnosis present

## 2018-06-14 DIAGNOSIS — Z515 Encounter for palliative care: Secondary | ICD-10-CM | POA: Diagnosis not present

## 2018-06-14 DIAGNOSIS — I120 Hypertensive chronic kidney disease with stage 5 chronic kidney disease or end stage renal disease: Secondary | ICD-10-CM | POA: Diagnosis not present

## 2018-06-14 DIAGNOSIS — I340 Nonrheumatic mitral (valve) insufficiency: Secondary | ICD-10-CM | POA: Diagnosis not present

## 2018-06-14 DIAGNOSIS — D62 Acute posthemorrhagic anemia: Secondary | ICD-10-CM | POA: Diagnosis not present

## 2018-06-14 DIAGNOSIS — E875 Hyperkalemia: Secondary | ICD-10-CM | POA: Diagnosis present

## 2018-06-14 DIAGNOSIS — R739 Hyperglycemia, unspecified: Secondary | ICD-10-CM | POA: Diagnosis present

## 2018-06-14 DIAGNOSIS — D696 Thrombocytopenia, unspecified: Secondary | ICD-10-CM | POA: Diagnosis present

## 2018-06-14 DIAGNOSIS — Z9119 Patient's noncompliance with other medical treatment and regimen: Secondary | ICD-10-CM

## 2018-06-14 DIAGNOSIS — I10 Essential (primary) hypertension: Secondary | ICD-10-CM | POA: Diagnosis present

## 2018-06-14 DIAGNOSIS — R578 Other shock: Secondary | ICD-10-CM | POA: Diagnosis present

## 2018-06-14 DIAGNOSIS — J9690 Respiratory failure, unspecified, unspecified whether with hypoxia or hypercapnia: Secondary | ICD-10-CM

## 2018-06-14 DIAGNOSIS — Z8679 Personal history of other diseases of the circulatory system: Secondary | ICD-10-CM | POA: Diagnosis not present

## 2018-06-14 DIAGNOSIS — Z9911 Dependence on respirator [ventilator] status: Secondary | ICD-10-CM

## 2018-06-14 DIAGNOSIS — Z978 Presence of other specified devices: Secondary | ICD-10-CM

## 2018-06-14 DIAGNOSIS — K661 Hemoperitoneum: Secondary | ICD-10-CM | POA: Diagnosis present

## 2018-06-14 DIAGNOSIS — I469 Cardiac arrest, cause unspecified: Secondary | ICD-10-CM | POA: Diagnosis not present

## 2018-06-14 DIAGNOSIS — I713 Abdominal aortic aneurysm, ruptured, unspecified: Secondary | ICD-10-CM | POA: Diagnosis present

## 2018-06-14 DIAGNOSIS — Z992 Dependence on renal dialysis: Secondary | ICD-10-CM | POA: Diagnosis not present

## 2018-06-14 DIAGNOSIS — I370 Nonrheumatic pulmonary valve stenosis: Secondary | ICD-10-CM | POA: Diagnosis not present

## 2018-06-14 DIAGNOSIS — D689 Coagulation defect, unspecified: Secondary | ICD-10-CM | POA: Diagnosis not present

## 2018-06-14 HISTORY — PX: THORACIC AORTIC ENDOVASCULAR STENT GRAFT: SHX6112

## 2018-06-14 HISTORY — PX: MESENTERIC ARTERY BYPASS: SHX5968

## 2018-06-14 HISTORY — DX: Abdominal aortic aneurysm, ruptured, unspecified: I71.30

## 2018-06-14 HISTORY — PX: THROMBECTOMY FEMORAL ARTERY: SHX6406

## 2018-06-14 HISTORY — DX: Dependence on renal dialysis: Z99.2

## 2018-06-14 HISTORY — DX: Abdominal aortic aneurysm, ruptured: I71.3

## 2018-06-14 HISTORY — DX: End stage renal disease: N18.6

## 2018-06-14 HISTORY — PX: INTRAVASCULAR ULTRASOUND/IVUS: CATH118244

## 2018-06-14 LAB — POCT I-STAT 3, VENOUS BLOOD GAS (G3P V)
Acid-base deficit: 3 mmol/L — ABNORMAL HIGH (ref 0.0–2.0)
Bicarbonate: 23.6 mmol/L (ref 20.0–28.0)
O2 SAT: 87 %
TCO2: 25 mmol/L (ref 22–32)
pCO2, Ven: 52 mmHg (ref 44.0–60.0)
pH, Ven: 7.264 (ref 7.250–7.430)
pO2, Ven: 61 mmHg — ABNORMAL HIGH (ref 32.0–45.0)

## 2018-06-14 LAB — CBC
HCT: 27.7 % — ABNORMAL LOW (ref 39.0–52.0)
Hemoglobin: 8.8 g/dL — ABNORMAL LOW (ref 13.0–17.0)
MCH: 28.9 pg (ref 26.0–34.0)
MCHC: 31.8 g/dL (ref 30.0–36.0)
MCV: 90.8 fL (ref 80.0–100.0)
Platelets: 80 10*3/uL — ABNORMAL LOW (ref 150–400)
RBC: 3.05 MIL/uL — ABNORMAL LOW (ref 4.22–5.81)
RDW: 15 % (ref 11.5–15.5)
WBC: 14.1 10*3/uL — ABNORMAL HIGH (ref 4.0–10.5)
nRBC: 0.5 % — ABNORMAL HIGH (ref 0.0–0.2)

## 2018-06-14 LAB — POCT I-STAT 7, (LYTES, BLD GAS, ICA,H+H)
Acid-base deficit: 5 mmol/L — ABNORMAL HIGH (ref 0.0–2.0)
Acid-base deficit: 9 mmol/L — ABNORMAL HIGH (ref 0.0–2.0)
Acid-base deficit: 14 mmol/L — ABNORMAL HIGH (ref 0.0–2.0)
Acid-base deficit: 11 mmol/L — ABNORMAL HIGH (ref 0.0–2.0)
BICARBONATE: 16.3 mmol/L — AB (ref 20.0–28.0)
Bicarbonate: 20.4 mmol/L (ref 20.0–28.0)
Bicarbonate: 13.8 mmol/L — ABNORMAL LOW (ref 20.0–28.0)
Bicarbonate: 15.7 mmol/L — ABNORMAL LOW (ref 20.0–28.0)
CALCIUM ION: 1.08 mmol/L — AB (ref 1.15–1.40)
Calcium, Ion: 1.04 mmol/L — ABNORMAL LOW (ref 1.15–1.40)
Calcium, Ion: 1.08 mmol/L — ABNORMAL LOW (ref 1.15–1.40)
Calcium, Ion: 1.11 mmol/L — ABNORMAL LOW (ref 1.15–1.40)
HCT: 31 % — ABNORMAL LOW (ref 39.0–52.0)
HCT: 30 % — ABNORMAL LOW (ref 39.0–52.0)
HCT: 24 % — ABNORMAL LOW (ref 39.0–52.0)
HCT: 25 % — ABNORMAL LOW (ref 39.0–52.0)
Hemoglobin: 10.5 g/dL — ABNORMAL LOW (ref 13.0–17.0)
Hemoglobin: 10.2 g/dL — ABNORMAL LOW (ref 13.0–17.0)
Hemoglobin: 8.2 g/dL — ABNORMAL LOW (ref 13.0–17.0)
Hemoglobin: 8.5 g/dL — ABNORMAL LOW (ref 13.0–17.0)
O2 Saturation: 100 %
O2 Saturation: 100 %
O2 Saturation: 100 %
O2 Saturation: 100 %
Patient temperature: 36.5
Patient temperature: 36
Patient temperature: 35.4
Patient temperature: 35.2
Potassium: 5.7 mmol/L — ABNORMAL HIGH (ref 3.5–5.1)
Potassium: 7.3 mmol/L (ref 3.5–5.1)
Potassium: 6.2 mmol/L — ABNORMAL HIGH (ref 3.5–5.1)
Potassium: 6 mmol/L — ABNORMAL HIGH (ref 3.5–5.1)
SODIUM: 143 mmol/L (ref 135–145)
Sodium: 137 mmol/L (ref 135–145)
Sodium: 135 mmol/L (ref 135–145)
Sodium: 141 mmol/L (ref 135–145)
TCO2: 22 mmol/L (ref 22–32)
TCO2: 17 mmol/L — ABNORMAL LOW (ref 22–32)
TCO2: 15 mmol/L — ABNORMAL LOW (ref 22–32)
TCO2: 17 mmol/L — ABNORMAL LOW (ref 22–32)
pCO2 arterial: 39.5 mmHg (ref 32.0–48.0)
pCO2 arterial: 31.8 mmHg — ABNORMAL LOW (ref 32.0–48.0)
pCO2 arterial: 37.4 mmHg (ref 32.0–48.0)
pCO2 arterial: 36.3 mmHg (ref 32.0–48.0)
pH, Arterial: 7.318 — ABNORMAL LOW (ref 7.350–7.450)
pH, Arterial: 7.313 — ABNORMAL LOW (ref 7.350–7.450)
pH, Arterial: 7.166 — CL (ref 7.350–7.450)
pH, Arterial: 7.235 — ABNORMAL LOW (ref 7.350–7.450)
pO2, Arterial: 199 mmHg — ABNORMAL HIGH (ref 83.0–108.0)
pO2, Arterial: 218 mmHg — ABNORMAL HIGH (ref 83.0–108.0)
pO2, Arterial: 335 mmHg — ABNORMAL HIGH (ref 83.0–108.0)
pO2, Arterial: 467 mmHg — ABNORMAL HIGH (ref 83.0–108.0)

## 2018-06-14 LAB — POCT I-STAT 4, (NA,K, GLUC, HGB,HCT)
GLUCOSE: 95 mg/dL (ref 70–99)
HEMATOCRIT: 23 % — AB (ref 39.0–52.0)
Hemoglobin: 7.8 g/dL — ABNORMAL LOW (ref 13.0–17.0)
Potassium: 5.5 mmol/L — ABNORMAL HIGH (ref 3.5–5.1)
Sodium: 139 mmol/L (ref 135–145)

## 2018-06-14 LAB — COMPREHENSIVE METABOLIC PANEL
ALT: 15 U/L (ref 0–44)
AST: 18 U/L (ref 15–41)
Albumin: 2.3 g/dL — ABNORMAL LOW (ref 3.5–5.0)
Alkaline Phosphatase: 48 U/L (ref 38–126)
Anion gap: 18 — ABNORMAL HIGH (ref 5–15)
BUN: 62 mg/dL — ABNORMAL HIGH (ref 6–20)
CO2: 20 mmol/L — ABNORMAL LOW (ref 22–32)
Calcium: 8.8 mg/dL — ABNORMAL LOW (ref 8.9–10.3)
Chloride: 98 mmol/L (ref 98–111)
Creatinine, Ser: 12.5 mg/dL — ABNORMAL HIGH (ref 0.61–1.24)
GFR calc Af Amer: 5 mL/min — ABNORMAL LOW (ref >60)
GFR calc non Af Amer: 4 mL/min — ABNORMAL LOW (ref >60)
Glucose, Bld: 174 mg/dL — ABNORMAL HIGH (ref 70–99)
POTASSIUM: 5.6 mmol/L — AB (ref 3.5–5.1)
Sodium: 136 mmol/L (ref 135–145)
TOTAL PROTEIN: 5.6 g/dL — AB (ref 6.5–8.1)
Total Bilirubin: 0.8 mg/dL (ref 0.3–1.2)

## 2018-06-14 LAB — CBC WITH DIFFERENTIAL/PLATELET
Abs Immature Granulocytes: 0.19 10*3/uL — ABNORMAL HIGH (ref 0.00–0.07)
Basophils Absolute: 0 10*3/uL (ref 0.0–0.1)
Basophils Relative: 0 %
EOS ABS: 0 10*3/uL (ref 0.0–0.5)
Eosinophils Relative: 0 %
HCT: 24.7 % — ABNORMAL LOW (ref 39.0–52.0)
Hemoglobin: 7.3 g/dL — ABNORMAL LOW (ref 13.0–17.0)
IMMATURE GRANULOCYTES: 1 %
LYMPHS ABS: 1 10*3/uL (ref 0.7–4.0)
Lymphocytes Relative: 5 %
MCH: 26.8 pg (ref 26.0–34.0)
MCHC: 29.6 g/dL — ABNORMAL LOW (ref 30.0–36.0)
MCV: 90.8 fL (ref 80.0–100.0)
Monocytes Absolute: 1.6 10*3/uL — ABNORMAL HIGH (ref 0.1–1.0)
Monocytes Relative: 9 %
NEUTROS PCT: 85 %
NRBC: 0 % (ref 0.0–0.2)
Neutro Abs: 15.4 10*3/uL — ABNORMAL HIGH (ref 1.7–7.7)
Platelets: 381 10*3/uL (ref 150–400)
RBC: 2.72 MIL/uL — ABNORMAL LOW (ref 4.22–5.81)
RDW: 17.4 % — ABNORMAL HIGH (ref 11.5–15.5)
WBC: 18.2 10*3/uL — ABNORMAL HIGH (ref 4.0–10.5)

## 2018-06-14 LAB — BASIC METABOLIC PANEL
Anion gap: 19 — ABNORMAL HIGH (ref 5–15)
BUN: 58 mg/dL — ABNORMAL HIGH (ref 6–20)
CO2: 14 mmol/L — ABNORMAL LOW (ref 22–32)
Calcium: 7.8 mg/dL — ABNORMAL LOW (ref 8.9–10.3)
Chloride: 111 mmol/L (ref 98–111)
Creatinine, Ser: 10.34 mg/dL — ABNORMAL HIGH (ref 0.61–1.24)
GFR calc Af Amer: 6 mL/min — ABNORMAL LOW (ref >60)
GFR calc non Af Amer: 5 mL/min — ABNORMAL LOW (ref >60)
Glucose, Bld: 116 mg/dL — ABNORMAL HIGH (ref 70–99)
Potassium: 6.2 mmol/L — ABNORMAL HIGH (ref 3.5–5.1)
Sodium: 144 mmol/L (ref 135–145)

## 2018-06-14 LAB — BLOOD PRODUCT ORDER (VERBAL) VERIFICATION

## 2018-06-14 LAB — PREPARE FRESH FROZEN PLASMA
Unit division: 0
Unit division: 0

## 2018-06-14 LAB — GLUCOSE, CAPILLARY: GLUCOSE-CAPILLARY: 166 mg/dL — AB (ref 70–99)

## 2018-06-14 LAB — I-STAT ARTERIAL BLOOD GAS, ED
Acid-base deficit: 6 mmol/L — ABNORMAL HIGH (ref 0.0–2.0)
Bicarbonate: 19.5 mmol/L — ABNORMAL LOW (ref 20.0–28.0)
O2 Saturation: 100 %
Patient temperature: 96.5
TCO2: 21 mmol/L — AB (ref 22–32)
pCO2 arterial: 33.8 mmHg (ref 32.0–48.0)
pH, Arterial: 7.364 (ref 7.350–7.450)
pO2, Arterial: 381 mmHg — ABNORMAL HIGH (ref 83.0–108.0)

## 2018-06-14 LAB — ABO/RH: ABO/RH(D): B POS

## 2018-06-14 LAB — DIC (DISSEMINATED INTRAVASCULAR COAGULATION) PANEL: APTT: 40 s — AB (ref 24–36)

## 2018-06-14 LAB — I-STAT CHEM 8, ED
BUN: 57 mg/dL — ABNORMAL HIGH (ref 6–20)
CHLORIDE: 100 mmol/L (ref 98–111)
Calcium, Ion: 1.11 mmol/L — ABNORMAL LOW (ref 1.15–1.40)
Creatinine, Ser: 12.7 mg/dL — ABNORMAL HIGH (ref 0.61–1.24)
Glucose, Bld: 168 mg/dL — ABNORMAL HIGH (ref 70–99)
HCT: 24 % — ABNORMAL LOW (ref 39.0–52.0)
Hemoglobin: 8.2 g/dL — ABNORMAL LOW (ref 13.0–17.0)
Potassium: 5.4 mmol/L — ABNORMAL HIGH (ref 3.5–5.1)
Sodium: 133 mmol/L — ABNORMAL LOW (ref 135–145)
TCO2: 24 mmol/L (ref 22–32)

## 2018-06-14 LAB — BPAM FFP
Blood Product Expiration Date: 201912142359
Blood Product Expiration Date: 201912142359
ISSUE DATE / TIME: 201911290210
ISSUE DATE / TIME: 201911290210
UNIT TYPE AND RH: 600
Unit Type and Rh: 600

## 2018-06-14 LAB — PREPARE RBC (CROSSMATCH)

## 2018-06-14 LAB — I-STAT CG4 LACTIC ACID, ED: Lactic Acid, Venous: 5.1 mmol/L (ref 0.5–1.9)

## 2018-06-14 LAB — DIC (DISSEMINATED INTRAVASCULAR COAGULATION)PANEL
D-Dimer, Quant: 10.8 ug/mL-FEU — ABNORMAL HIGH (ref 0.00–0.50)
Fibrinogen: 145 mg/dL — ABNORMAL LOW (ref 210–475)
INR: 3.07
Platelets: 81 10*3/uL — ABNORMAL LOW (ref 150–400)
Prothrombin Time: 31.2 seconds — ABNORMAL HIGH (ref 11.4–15.2)
Smear Review: NONE SEEN

## 2018-06-14 LAB — SURGICAL PCR SCREEN
MRSA, PCR: NEGATIVE
Staphylococcus aureus: NEGATIVE

## 2018-06-14 LAB — PROTIME-INR
INR: 1.5
INR: 2.08
PROTHROMBIN TIME: 17.9 s — AB (ref 11.4–15.2)
Prothrombin Time: 23.1 seconds — ABNORMAL HIGH (ref 11.4–15.2)

## 2018-06-14 LAB — CORTISOL: Cortisol, Plasma: 21.3 ug/dL

## 2018-06-14 LAB — I-STAT TROPONIN, ED: Troponin i, poc: 0.23 ng/mL (ref 0.00–0.08)

## 2018-06-14 LAB — LACTIC ACID, PLASMA: Lactic Acid, Venous: 10.6 mmol/L (ref 0.5–1.9)

## 2018-06-14 LAB — MAGNESIUM: Magnesium: 2.2 mg/dL (ref 1.7–2.4)

## 2018-06-14 LAB — APTT: aPTT: 43 seconds — ABNORMAL HIGH (ref 24–36)

## 2018-06-14 SURGERY — INSERTION, ENDOVASCULAR STENT GRAFT, AORTA, ABDOMINAL
Anesthesia: General

## 2018-06-14 SURGERY — INSERTION, ENDOVASCULAR STENT GRAFT, AORTA, THORACIC
Anesthesia: General | Laterality: Right

## 2018-06-14 MED ORDER — LIDOCAINE HCL 1 % IJ SOLN
INTRAMUSCULAR | Status: DC | PRN
  Administered 2018-06-14: 30 mL

## 2018-06-14 MED ORDER — CALCIUM CHLORIDE 10 % IV SOLN
INTRAVENOUS | Status: DC | PRN
  Administered 2018-06-14: 200 mg via INTRAVENOUS
  Administered 2018-06-14 (×3): 100 mg via INTRAVENOUS

## 2018-06-14 MED ORDER — NOREPINEPHRINE 4 MG/250ML-% IV SOLN
INTRAVENOUS | Status: AC
  Administered 2018-06-14: 2.25 ug/min via INTRAVENOUS
  Filled 2018-06-14: qty 250

## 2018-06-14 MED ORDER — FENTANYL CITRATE (PF) 100 MCG/2ML IJ SOLN
100.0000 ug | INTRAMUSCULAR | Status: DC | PRN
  Administered 2018-06-15 (×2): 100 ug via INTRAVENOUS
  Filled 2018-06-14 (×4): qty 2

## 2018-06-14 MED ORDER — POLYVINYL ALCOHOL 1.4 % OP SOLN: 1.0000 [drp] | Freq: Four times a day (QID) | OPHTHALMIC | Status: DC | PRN

## 2018-06-14 MED ORDER — SODIUM CHLORIDE 0.9 % IV SOLN
20.0000 ug | Freq: Once | INTRAVENOUS | Status: AC
  Administered 2018-06-14: 20 ug via INTRAVENOUS
  Filled 2018-06-14: qty 5

## 2018-06-14 MED ORDER — HALOPERIDOL LACTATE 2 MG/ML PO CONC
0.5000 mg | ORAL | Status: DC | PRN
  Filled 2018-06-14: qty 0.3

## 2018-06-14 MED ORDER — SODIUM CHLORIDE 0.9 % IV BOLUS: 2000.0000 mL | Freq: Once | INTRAVENOUS | Status: DC

## 2018-06-14 MED ORDER — LABETALOL HCL 5 MG/ML IV SOLN: 10.0000 mg | INTRAVENOUS | Status: DC | PRN

## 2018-06-14 MED ORDER — NOREPINEPHRINE BITARTRATE 1 MG/ML IV SOLN
INTRAVENOUS | Status: DC | PRN
  Administered 2018-06-14: 2 ug/min via INTRAVENOUS

## 2018-06-14 MED ORDER — GUAIFENESIN-DM 100-10 MG/5ML PO SYRP: 15.0000 mL | ORAL_SOLUTION | ORAL | Status: DC | PRN

## 2018-06-14 MED ORDER — SODIUM CHLORIDE 0.9 % IV SOLN
0.0000 ug/min | INTRAVENOUS | Status: AC
  Administered 2018-06-14: 300 ug/min via INTRAVENOUS
  Administered 2018-06-15: 400 ug/min via INTRAVENOUS
  Filled 2018-06-14: qty 4
  Filled 2018-06-14: qty 40

## 2018-06-14 MED ORDER — FENTANYL CITRATE (PF) 100 MCG/2ML IJ SOLN
INTRAMUSCULAR | Status: AC
  Filled 2018-06-14: qty 2

## 2018-06-14 MED ORDER — SODIUM CHLORIDE 0.9 % IV SOLN: 10.0000 mL/h | Freq: Once | INTRAVENOUS | Status: DC

## 2018-06-14 MED ORDER — SODIUM CHLORIDE 0.9 % IV SOLN: INTRAVENOUS | Status: DC

## 2018-06-14 MED ORDER — GLYCOPYRROLATE 0.2 MG/ML IJ SOLN: 0.2000 mg | INTRAMUSCULAR | Status: DC | PRN

## 2018-06-14 MED ORDER — HEPARIN SODIUM (PORCINE) 1000 UNIT/ML IJ SOLN
INTRAMUSCULAR | Status: AC
  Filled 2018-06-14: qty 1

## 2018-06-14 MED ORDER — LIDOCAINE-EPINEPHRINE (PF) 1 %-1:200000 IJ SOLN
INTRAMUSCULAR | Status: AC
  Filled 2018-06-14: qty 30

## 2018-06-14 MED ORDER — INSULIN REGULAR HUMAN 100 UNIT/ML IJ SOLN
20.0000 [IU] | Freq: Once | INTRAMUSCULAR | Status: DC
  Administered 2018-06-14: 20 [IU] via SUBCUTANEOUS
  Filled 2018-06-14 (×2): qty 10

## 2018-06-14 MED ORDER — CEFAZOLIN SODIUM-DEXTROSE 2-3 GM-%(50ML) IV SOLR
INTRAVENOUS | Status: DC | PRN
  Administered 2018-06-14 (×2): 2 g via INTRAVENOUS

## 2018-06-14 MED ORDER — MIDAZOLAM HCL 2 MG/2ML IJ SOLN
INTRAMUSCULAR | Status: AC
  Filled 2018-06-14: qty 2

## 2018-06-14 MED ORDER — VASOPRESSIN 20 UNIT/ML IV SOLN
INTRAVENOUS | Status: AC
  Filled 2018-06-14: qty 1

## 2018-06-14 MED ORDER — CEFAZOLIN SODIUM-DEXTROSE 2-4 GM/100ML-% IV SOLN
2.0000 g | Freq: Three times a day (TID) | INTRAVENOUS | Status: DC
  Administered 2018-06-15: 2 g via INTRAVENOUS
  Filled 2018-06-14 (×2): qty 100

## 2018-06-14 MED ORDER — VASOPRESSIN 20 UNIT/ML IV SOLN
INTRAVENOUS | Status: DC | PRN
  Administered 2018-06-14: 5 [IU] via INTRAVENOUS
  Administered 2018-06-14: 3 [IU] via INTRAVENOUS
  Administered 2018-06-14 (×2): 4 [IU] via INTRAVENOUS
  Administered 2018-06-14: 3 [IU] via INTRAVENOUS
  Administered 2018-06-14: 2 [IU] via INTRAVENOUS
  Administered 2018-06-14 (×2): 3 [IU] via INTRAVENOUS

## 2018-06-14 MED ORDER — PHENYLEPHRINE HCL 10 MG/ML IJ SOLN
INTRAMUSCULAR | Status: AC
  Filled 2018-06-14: qty 2

## 2018-06-14 MED ORDER — PRISMASOL BGK 4/2.5 32-4-2.5 MEQ/L IV SOLN
INTRAVENOUS | Status: DC
  Administered 2018-06-15 (×7): via INTRAVENOUS_CENTRAL
  Filled 2018-06-14 (×14): qty 5000

## 2018-06-14 MED ORDER — DEXTROSE 50 % IV SOLN
1.0000 | Freq: Once | INTRAVENOUS | Status: AC
  Administered 2018-06-14 – 2018-06-15 (×3): 50 mL via INTRAVENOUS
  Administered 2018-06-15: 25 mL via INTRAVENOUS
  Filled 2018-06-14: qty 50

## 2018-06-14 MED ORDER — PHENYLEPHRINE 40 MCG/ML (10ML) SYRINGE FOR IV PUSH (FOR BLOOD PRESSURE SUPPORT)
PREFILLED_SYRINGE | INTRAVENOUS | Status: AC
  Filled 2018-06-14: qty 30

## 2018-06-14 MED ORDER — ACETAMINOPHEN 650 MG RE SUPP: 650.0000 mg | Freq: Four times a day (QID) | RECTAL | Status: DC | PRN

## 2018-06-14 MED ORDER — NOREPINEPHRINE 4 MG/250ML-% IV SOLN
0.0000 ug/min | INTRAVENOUS | Status: DC
  Administered 2018-06-14: 2.25 ug/min via INTRAVENOUS
  Filled 2018-06-14: qty 250

## 2018-06-14 MED ORDER — SODIUM CHLORIDE 0.9 % IV SOLN
INTRAVENOUS | Status: DC | PRN
  Administered 2018-06-14: 75 ug/min via INTRAVENOUS
  Administered 2018-06-14: 20:00:00 via INTRAVENOUS

## 2018-06-14 MED ORDER — FENTANYL CITRATE (PF) 100 MCG/2ML IJ SOLN
100.0000 ug | INTRAMUSCULAR | Status: AC | PRN
  Administered 2018-06-14 – 2018-06-15 (×3): 100 ug via INTRAVENOUS
  Filled 2018-06-14 (×2): qty 2

## 2018-06-14 MED ORDER — SODIUM CHLORIDE 0.9% FLUSH: 3.0000 mL | INTRAVENOUS | Status: DC | PRN

## 2018-06-14 MED ORDER — SODIUM CHLORIDE 0.9% IV SOLUTION: Freq: Once | INTRAVENOUS | Status: DC

## 2018-06-14 MED ORDER — CEFAZOLIN SODIUM-DEXTROSE 2-4 GM/100ML-% IV SOLN
2.0000 g | Freq: Two times a day (BID) | INTRAVENOUS | Status: DC
  Administered 2018-06-15 (×2): 2 g via INTRAVENOUS
  Filled 2018-06-14 (×3): qty 100

## 2018-06-14 MED ORDER — CEFAZOLIN SODIUM 1 G IJ SOLR
INTRAMUSCULAR | Status: AC
  Filled 2018-06-14: qty 40

## 2018-06-14 MED ORDER — EPINEPHRINE PF 1 MG/ML IJ SOLN
0.5000 ug/min | INTRAVENOUS | Status: DC
  Administered 2018-06-14: 2 ug/min via INTRAVENOUS
  Filled 2018-06-14: qty 4

## 2018-06-14 MED ORDER — MIDAZOLAM HCL 2 MG/2ML IJ SOLN: 2.0000 mg | INTRAMUSCULAR | Status: DC | PRN

## 2018-06-14 MED ORDER — METOPROLOL TARTRATE 5 MG/5ML IV SOLN: 2.0000 mg | INTRAVENOUS | Status: DC | PRN

## 2018-06-14 MED ORDER — MIDAZOLAM HCL 5 MG/5ML IJ SOLN
INTRAMUSCULAR | Status: DC | PRN
  Administered 2018-06-14: 2 mg via INTRAVENOUS

## 2018-06-14 MED ORDER — ETOMIDATE 2 MG/ML IV SOLN
INTRAVENOUS | Status: DC | PRN
  Administered 2018-06-14: 14 mg via INTRAVENOUS

## 2018-06-14 MED ORDER — FENTANYL CITRATE (PF) 100 MCG/2ML IJ SOLN
25.0000 ug | INTRAMUSCULAR | Status: DC | PRN
  Administered 2018-06-14: 100 ug via INTRAVENOUS
  Filled 2018-06-14: qty 2

## 2018-06-14 MED ORDER — HYDRALAZINE HCL 20 MG/ML IJ SOLN: 5.0000 mg | INTRAMUSCULAR | Status: DC | PRN

## 2018-06-14 MED ORDER — SODIUM CHLORIDE 0.9 % IV SOLN
1.0000 g | Freq: Once | INTRAVENOUS | Status: AC
  Administered 2018-06-14: 1 g via INTRAVENOUS
  Filled 2018-06-14: qty 10

## 2018-06-14 MED ORDER — HEPARIN SODIUM (PORCINE) 5000 UNIT/ML IJ SOLN
INTRAMUSCULAR | Status: AC
  Filled 2018-06-14: qty 1

## 2018-06-14 MED ORDER — HALOPERIDOL LACTATE 5 MG/ML IJ SOLN: 0.5000 mg | INTRAMUSCULAR | Status: DC | PRN

## 2018-06-14 MED ORDER — SODIUM CHLORIDE (PF) 0.9 % IJ SOLN
INTRAMUSCULAR | Status: AC
  Filled 2018-06-14: qty 10

## 2018-06-14 MED ORDER — SODIUM CHLORIDE 0.9 % FOR CRRT
INTRAVENOUS_CENTRAL | Status: DC | PRN
  Filled 2018-06-14: qty 1000

## 2018-06-14 MED ORDER — FENTANYL CITRATE (PF) 100 MCG/2ML IJ SOLN
INTRAMUSCULAR | Status: DC | PRN
  Administered 2018-06-14: 100 ug via INTRAVENOUS

## 2018-06-14 MED ORDER — MIDAZOLAM HCL 5 MG/5ML IJ SOLN
INTRAMUSCULAR | Status: DC | PRN
  Administered 2018-06-14 (×2): 1 mg via INTRAVENOUS

## 2018-06-14 MED ORDER — INSULIN REGULAR BOLUS VIA INFUSION
10.0000 [IU] | Freq: Once | INTRAVENOUS | Status: AC
  Administered 2018-06-14: 10 [IU] via INTRAVENOUS
  Filled 2018-06-14: qty 10

## 2018-06-14 MED ORDER — BIOTENE DRY MOUTH MT LIQD: 15.0000 mL | OROMUCOSAL | Status: DC | PRN

## 2018-06-14 MED ORDER — PRISMASOL BGK 4/2.5 32-4-2.5 MEQ/L REPLACEMENT SOLN
Status: DC
  Administered 2018-06-15 (×3): via INTRAVENOUS_CENTRAL
  Filled 2018-06-14 (×5): qty 5000

## 2018-06-14 MED ORDER — SUCCINYLCHOLINE CHLORIDE 200 MG/10ML IV SOSY
PREFILLED_SYRINGE | INTRAVENOUS | Status: AC
  Filled 2018-06-14: qty 20

## 2018-06-14 MED ORDER — BISACODYL 10 MG RE SUPP: 10.0000 mg | Freq: Every day | RECTAL | Status: DC | PRN

## 2018-06-14 MED ORDER — PRISMASOL BGK 4/2.5 32-4-2.5 MEQ/L REPLACEMENT SOLN
Status: DC
  Administered 2018-06-15 (×5): via INTRAVENOUS_CENTRAL
  Filled 2018-06-14 (×10): qty 5000

## 2018-06-14 MED ORDER — SODIUM CHLORIDE 0.9 % IV SOLN
1.0000 mg/h | INTRAVENOUS | Status: DC
  Filled 2018-06-14: qty 5

## 2018-06-14 MED ORDER — MAGNESIUM SULFATE 2 GM/50ML IV SOLN: 2.0000 g | Freq: Once | INTRAVENOUS | Status: DC | PRN

## 2018-06-14 MED ORDER — CALCIUM GLUCONATE-NACL 1-0.675 GM/50ML-% IV SOLN: 1.0000 g | Freq: Once | INTRAVENOUS | Status: DC

## 2018-06-14 MED ORDER — SODIUM BICARBONATE 4.2 % IV SOLN
INTRAVENOUS | Status: DC | PRN
  Administered 2018-06-14: 100 meq via INTRAVENOUS
  Administered 2018-06-14 (×2): 50 meq via INTRAVENOUS

## 2018-06-14 MED ORDER — EPINEPHRINE PF 1 MG/10ML IJ SOSY
PREFILLED_SYRINGE | INTRAMUSCULAR | Status: AC
  Filled 2018-06-14: qty 10

## 2018-06-14 MED ORDER — HALOPERIDOL 0.5 MG PO TABS
0.5000 mg | ORAL_TABLET | ORAL | Status: DC | PRN
  Filled 2018-06-14: qty 0.5

## 2018-06-14 MED ORDER — INSULIN ASPART 100 UNIT/ML ~~LOC~~ SOLN
0.0000 [IU] | SUBCUTANEOUS | Status: DC
  Administered 2018-06-15: 3 [IU] via SUBCUTANEOUS

## 2018-06-14 MED ORDER — SODIUM CHLORIDE 0.9 % IV SOLN
INTRAVENOUS | Status: AC
  Filled 2018-06-14: qty 1.2

## 2018-06-14 MED ORDER — DEXTROSE 50 % IV SOLN
INTRAVENOUS | Status: AC
  Filled 2018-06-14: qty 50

## 2018-06-14 MED ORDER — PROPOFOL 1000 MG/100ML IV EMUL
INTRAVENOUS | Status: AC
  Filled 2018-06-14: qty 100

## 2018-06-14 MED ORDER — NOREPINEPHRINE 4 MG/250ML-% IV SOLN
INTRAVENOUS | Status: AC
  Filled 2018-06-14: qty 250

## 2018-06-14 MED ORDER — CALCIUM CHLORIDE 10 % IV SOLN
INTRAVENOUS | Status: AC
  Filled 2018-06-14: qty 20

## 2018-06-14 MED ORDER — ETOMIDATE 2 MG/ML IV SOLN
INTRAVENOUS | Status: AC
  Filled 2018-06-14: qty 10

## 2018-06-14 MED ORDER — PANTOPRAZOLE SODIUM 40 MG IV SOLR
40.0000 mg | Freq: Every day | INTRAVENOUS | Status: DC
  Administered 2018-06-15 (×2): 40 mg via INTRAVENOUS
  Filled 2018-06-14: qty 40

## 2018-06-14 MED ORDER — FENTANYL CITRATE (PF) 250 MCG/5ML IJ SOLN
INTRAMUSCULAR | Status: AC
  Filled 2018-06-14: qty 5

## 2018-06-14 MED ORDER — ACETAMINOPHEN 325 MG PO TABS: 650.0000 mg | ORAL_TABLET | Freq: Four times a day (QID) | ORAL | Status: DC | PRN

## 2018-06-14 MED ORDER — 0.9 % SODIUM CHLORIDE (POUR BTL) OPTIME
TOPICAL | Status: DC | PRN
  Administered 2018-06-14: 1000 mL
  Administered 2018-06-14: 2000 mL

## 2018-06-14 MED ORDER — SODIUM CHLORIDE 0.9 % IV SOLN
250.0000 mL | INTRAVENOUS | Status: DC | PRN
  Administered 2018-06-14 (×3): via INTRAVENOUS

## 2018-06-14 MED ORDER — FENTANYL CITRATE (PF) 100 MCG/2ML IJ SOLN
INTRAMUSCULAR | Status: DC | PRN
  Administered 2018-06-14: 50 ug via INTRAVENOUS

## 2018-06-14 MED ORDER — SODIUM CHLORIDE 0.9% FLUSH
3.0000 mL | Freq: Two times a day (BID) | INTRAVENOUS | Status: DC
  Administered 2018-06-14: 3 mL via INTRAVENOUS

## 2018-06-14 MED ORDER — MIDAZOLAM HCL 2 MG/2ML IJ SOLN
2.0000 mg | INTRAMUSCULAR | Status: DC | PRN
  Administered 2018-06-15 (×3): 2 mg via INTRAVENOUS
  Filled 2018-06-14 (×3): qty 2

## 2018-06-14 MED ORDER — LIDOCAINE HCL (CARDIAC) PF 100 MG/5ML IV SOSY
PREFILLED_SYRINGE | INTRAVENOUS | Status: DC | PRN
  Administered 2018-06-14: 30 mg via INTRAVENOUS

## 2018-06-14 MED ORDER — SODIUM CHLORIDE 0.9 % IV SOLN
Freq: Once | INTRAVENOUS | Status: AC
  Administered 2018-06-14: 02:00:00 via INTRAVENOUS

## 2018-06-14 MED ORDER — FENTANYL CITRATE (PF) 100 MCG/2ML IJ SOLN
100.0000 ug | INTRAMUSCULAR | Status: DC | PRN
  Administered 2018-06-14: 100 ug via INTRAVENOUS

## 2018-06-14 MED ORDER — PHENOL 1.4 % MT LIQD: 1.0000 | OROMUCOSAL | Status: DC | PRN

## 2018-06-14 MED ORDER — MORPHINE SULFATE (PF) 2 MG/ML IV SOLN: 2.0000 mg | INTRAVENOUS | Status: DC | PRN

## 2018-06-14 MED ORDER — HEPARIN SODIUM (PORCINE) 1000 UNIT/ML DIALYSIS
1000.0000 [IU] | INTRAMUSCULAR | Status: DC | PRN
  Administered 2018-06-16: 3600 [IU] via INTRAVENOUS_CENTRAL
  Filled 2018-06-14: qty 3
  Filled 2018-06-14 (×2): qty 6

## 2018-06-14 MED ORDER — MIDAZOLAM HCL 2 MG/2ML IJ SOLN
2.0000 mg | INTRAMUSCULAR | Status: AC | PRN
  Administered 2018-06-14 – 2018-06-15 (×3): 2 mg via INTRAVENOUS
  Filled 2018-06-14 (×3): qty 2

## 2018-06-14 MED ORDER — DEXTROSE 50 % IV SOLN
INTRAVENOUS | Status: DC | PRN
  Administered 2018-06-14: 25 g via INTRAVENOUS

## 2018-06-14 MED ORDER — PHENYLEPHRINE HCL-NACL 10-0.9 MG/250ML-% IV SOLN
0.0000 ug/min | INTRAVENOUS | Status: DC
  Administered 2018-06-14: 200 ug/min via INTRAVENOUS
  Filled 2018-06-14: qty 250

## 2018-06-14 MED ORDER — HYDROMORPHONE BOLUS VIA INFUSION
0.5000 mg | INTRAVENOUS | Status: DC | PRN
  Filled 2018-06-14: qty 1

## 2018-06-14 MED ORDER — SODIUM CHLORIDE 0.9 % IV SOLN: 500.0000 mL | Freq: Once | INTRAVENOUS | Status: DC | PRN

## 2018-06-14 MED ORDER — HEMOSTATIC AGENTS (NO CHARGE) OPTIME
TOPICAL | Status: DC | PRN
  Administered 2018-06-14: 1 via TOPICAL

## 2018-06-14 MED ORDER — INSULIN REGULAR HUMAN 100 UNIT/ML IJ SOLN
20.0000 [IU] | Freq: Once | INTRAMUSCULAR | Status: DC
  Administered 2018-06-14: 20 [IU] via INTRAVENOUS
  Filled 2018-06-14: qty 10

## 2018-06-14 MED ORDER — GLYCOPYRROLATE 1 MG PO TABS
1.0000 mg | ORAL_TABLET | ORAL | Status: DC | PRN
  Filled 2018-06-14: qty 1

## 2018-06-14 MED ORDER — ROCURONIUM BROMIDE 50 MG/5ML IV SOSY
PREFILLED_SYRINGE | INTRAVENOUS | Status: AC
  Filled 2018-06-14: qty 10

## 2018-06-14 MED ORDER — ONDANSETRON HCL 4 MG/2ML IJ SOLN
INTRAMUSCULAR | Status: AC
  Filled 2018-06-14: qty 2

## 2018-06-14 MED ORDER — ALBUMIN HUMAN 5 % IV SOLN
INTRAVENOUS | Status: DC | PRN
  Administered 2018-06-14 (×3): via INTRAVENOUS

## 2018-06-14 MED ORDER — ONDANSETRON 4 MG PO TBDP: 4.0000 mg | ORAL_TABLET | Freq: Four times a day (QID) | ORAL | Status: DC | PRN

## 2018-06-14 MED ORDER — ONDANSETRON HCL 4 MG/2ML IJ SOLN: 4.0000 mg | Freq: Four times a day (QID) | INTRAMUSCULAR | Status: DC | PRN

## 2018-06-14 MED ORDER — LACTATED RINGERS IV SOLN
INTRAVENOUS | Status: DC
  Administered 2018-06-14: 14:00:00 via INTRAVENOUS

## 2018-06-14 MED ORDER — HEMOSTATIC AGENTS (NO CHARGE) OPTIME
TOPICAL | Status: DC | PRN
  Administered 2018-06-14 (×2): 1 via TOPICAL

## 2018-06-14 MED ORDER — PROPOFOL 10 MG/ML IV BOLUS
INTRAVENOUS | Status: DC | PRN
  Administered 2018-06-14: 50 mg via INTRAVENOUS

## 2018-06-14 MED ORDER — ROCURONIUM BROMIDE 100 MG/10ML IV SOLN
INTRAVENOUS | Status: DC | PRN
  Administered 2018-06-14: 50 mg via INTRAVENOUS
  Administered 2018-06-14: 20 mg via INTRAVENOUS
  Administered 2018-06-14: 30 mg via INTRAVENOUS

## 2018-06-14 MED ORDER — CISATRACURIUM BESYLATE 20 MG/10ML IV SOLN
INTRAVENOUS | Status: AC
  Filled 2018-06-14: qty 20

## 2018-06-14 MED ORDER — SODIUM CHLORIDE 0.9 % IV SOLN
INTRAVENOUS | Status: DC | PRN
  Administered 2018-06-14 (×2)

## 2018-06-14 MED ORDER — GLYCOPYRROLATE 0.2 MG/ML IJ SOLN
0.2000 mg | INTRAMUSCULAR | Status: DC | PRN
  Administered 2018-06-14: 0.2 mg via INTRAVENOUS
  Filled 2018-06-14: qty 1

## 2018-06-14 MED ORDER — FENTANYL CITRATE (PF) 100 MCG/2ML IJ SOLN: 100.0000 ug | INTRAMUSCULAR | Status: DC | PRN

## 2018-06-14 MED ORDER — IODIXANOL 320 MG/ML IV SOLN
INTRAVENOUS | Status: DC | PRN
  Administered 2018-06-14: 100 mL via INTRAVENOUS

## 2018-06-14 SURGICAL SUPPLY — 117 items
ADH SKN CLS APL DERMABOND .7 (GAUZE/BANDAGES/DRESSINGS) ×3
ATTRACTOMAT 16X20 MAGNETIC DRP (DRAPES) ×2 IMPLANT
BAG BANDED W/RUBBER/TAPE 36X54 (MISCELLANEOUS) ×2 IMPLANT
BAG EQP BAND 135X91 W/RBR TAPE (MISCELLANEOUS) ×3
BAG SNAP BAND KOVER 36X36 (MISCELLANEOUS) ×4 IMPLANT
BLADE CLIPPER SURG (BLADE) ×5 IMPLANT
CANISTER SUCT 3000ML PPV (MISCELLANEOUS) ×5 IMPLANT
CATH ACCU-VU SIZ PIG 5F 100CM (CATHETERS) ×2 IMPLANT
CATH ANGIO 5F BER2 100CM (CATHETERS) ×2 IMPLANT
CATH ANGIO 5F BER2 65CM (CATHETERS) IMPLANT
CATH BEACON 5.038 65CM KMP-01 (CATHETERS) IMPLANT
CATH EMB 4FR 80CM (CATHETERS) ×2 IMPLANT
CATH OMNI FLUSH .035X70CM (CATHETERS) IMPLANT
CATH VISIONS PV .035 IVUS (CATHETERS) ×2 IMPLANT
COVER BACK TABLE 60X90IN (DRAPES) ×4 IMPLANT
COVER DOME SNAP 22 D (MISCELLANEOUS) ×2 IMPLANT
COVER MAYO STAND STRL (DRAPES) ×2 IMPLANT
COVER PROBE W GEL 5X96 (DRAPES) ×5 IMPLANT
COVER WAND RF STERILE (DRAPES) ×5 IMPLANT
DERMABOND ADVANCED (GAUZE/BANDAGES/DRESSINGS) ×2
DERMABOND ADVANCED .7 DNX12 (GAUZE/BANDAGES/DRESSINGS) ×3 IMPLANT
DEVICE CLOSURE PERCLS PRGLD 6F (VASCULAR PRODUCTS) IMPLANT
DRAPE ZERO GRAVITY STERILE (DRAPES) ×5 IMPLANT
DRSG COVADERM 4X6 (GAUZE/BANDAGES/DRESSINGS) ×2 IMPLANT
DRSG TEGADERM 2-3/8X2-3/4 SM (GAUZE/BANDAGES/DRESSINGS) ×10 IMPLANT
DRSG VAC ATS SM SENSATRAC (GAUZE/BANDAGES/DRESSINGS) ×4 IMPLANT
DRYSEAL FLEXSHEATH 12FR 33CM (SHEATH) ×2
DRYSEAL FLEXSHEATH 20FR 33CM (SHEATH) ×2
ELECT BLADE 4.0 EZ CLEAN MEGAD (MISCELLANEOUS) ×10
ELECT CAUTERY BLADE 6.4 (BLADE) ×5 IMPLANT
ELECT REM PT RETURN 9FT ADLT (ELECTROSURGICAL) ×10
ELECTRODE BLDE 4.0 EZ CLN MEGD (MISCELLANEOUS) IMPLANT
ELECTRODE REM PT RTRN 9FT ADLT (ELECTROSURGICAL) ×6 IMPLANT
FELT TEFLON 1X6 (MISCELLANEOUS) ×2 IMPLANT
GAUZE 4X4 16PLY RFD (DISPOSABLE) ×2 IMPLANT
GAUZE SPONGE 2X2 8PLY STRL LF (GAUZE/BANDAGES/DRESSINGS) ×6 IMPLANT
GLOVE BIO SURGEON STRL SZ 6.5 (GLOVE) ×9 IMPLANT
GLOVE BIO SURGEON STRL SZ7.5 (GLOVE) ×13 IMPLANT
GLOVE BIO SURGEONS STRL SZ 6.5 (GLOVE) ×9
GLOVE BIOGEL PI IND STRL 6.5 (GLOVE) IMPLANT
GLOVE BIOGEL PI IND STRL 7.0 (GLOVE) IMPLANT
GLOVE BIOGEL PI IND STRL 7.5 (GLOVE) IMPLANT
GLOVE BIOGEL PI INDICATOR 6.5 (GLOVE) ×20
GLOVE BIOGEL PI INDICATOR 7.0 (GLOVE) ×2
GLOVE BIOGEL PI INDICATOR 7.5 (GLOVE) ×10
GLOVE SURG SS PI 6.5 STRL IVOR (GLOVE) ×4 IMPLANT
GLOVE SURG SS PI 7.5 STRL IVOR (GLOVE) ×4 IMPLANT
GOWN STRL REUS W/ TWL LRG LVL3 (GOWN DISPOSABLE) ×6 IMPLANT
GOWN STRL REUS W/ TWL XL LVL3 (GOWN DISPOSABLE) ×3 IMPLANT
GOWN STRL REUS W/TWL LRG LVL3 (GOWN DISPOSABLE) ×40
GOWN STRL REUS W/TWL XL LVL3 (GOWN DISPOSABLE) ×30
GRAFT BALLN CATH 65CM (STENTS) IMPLANT
GRAFT PROPATEN W/RING 8X80X70 (Vascular Products) ×2 IMPLANT
HEMOSTAT SNOW SURGICEL 2X4 (HEMOSTASIS) ×4 IMPLANT
INSERT FOGARTY 61MM (MISCELLANEOUS) ×2 IMPLANT
INSERT FOGARTY SM (MISCELLANEOUS) ×4 IMPLANT
KIT BASIN OR (CUSTOM PROCEDURE TRAY) ×5 IMPLANT
KIT TURNOVER KIT B (KITS) ×5 IMPLANT
NDL HYPO 25GX1X1/2 BEV (NEEDLE) IMPLANT
NDL PERC 18GX7CM (NEEDLE) ×3 IMPLANT
NEEDLE HYPO 25GX1X1/2 BEV (NEEDLE) ×5 IMPLANT
NEEDLE PERC 18GX7CM (NEEDLE) ×5 IMPLANT
NS IRRIG 1000ML POUR BTL (IV SOLUTION) ×5 IMPLANT
PACK ENDOVASCULAR (PACKS) ×5 IMPLANT
PAD ARMBOARD 7.5X6 YLW CONV (MISCELLANEOUS) ×10 IMPLANT
PENCIL BUTTON HOLSTER BLD 10FT (ELECTRODE) ×7 IMPLANT
PERCLOSE PROGLIDE 6F (VASCULAR PRODUCTS) ×10
PROTECTION STATION PRESSURIZED (MISCELLANEOUS) ×5
PUNCH AORTIC ROTATE 5MM 8IN (MISCELLANEOUS) ×2 IMPLANT
SET MICROPUNCTURE 5F STIFF (MISCELLANEOUS) ×2 IMPLANT
SHEATH AVANTI 11CM 8FR (SHEATH) IMPLANT
SHEATH BRITE TIP 8FR 23CM (SHEATH) IMPLANT
SHEATH DRYSEAL FLEX 12FR 33CM (SHEATH) IMPLANT
SHEATH DRYSEAL FLEX 20FR 33CM (SHEATH) IMPLANT
SHEATH PINNACLE 8F 10CM (SHEATH) ×2 IMPLANT
SHIELD RADPAD SCOOP 12X17 (MISCELLANEOUS) ×10 IMPLANT
SLEEVE ISOL F/PACE RF HD COVER (MISCELLANEOUS) ×2 IMPLANT
SPONGE ABDOMINAL VAC ABTHERA (MISCELLANEOUS) ×2 IMPLANT
SPONGE GAUZE 2X2 STER 10/PKG (GAUZE/BANDAGES/DRESSINGS) ×4
SPONGE LAP 18X18 RF (DISPOSABLE) ×8 IMPLANT
SPONGE LAP 18X18 X RAY DECT (DISPOSABLE) ×2 IMPLANT
STAPLER VISISTAT 35W (STAPLE) ×2 IMPLANT
STATION PROTECTION PRESSURIZED (MISCELLANEOUS) IMPLANT
STENT GRAFT BALLN CATH 65CM (STENTS) ×2
STENT GRFT THORAC ACS 31X31X15 (Endovascular Graft) ×2 IMPLANT
STENT GRFT THORAC ACS 31X31X20 (Endovascular Graft) ×4 IMPLANT
STOPCOCK MORSE 400PSI 3WAY (MISCELLANEOUS) ×5 IMPLANT
SURGICEL SNOW 2X4 (HEMOSTASIS) ×2 IMPLANT
SUT ETHILON 3 0 PS 1 (SUTURE) IMPLANT
SUT MNCRL AB 4-0 PS2 18 (SUTURE) ×10 IMPLANT
SUT PROLENE 5 0 C 1 24 (SUTURE) ×14 IMPLANT
SUT PROLENE 6 0 BV (SUTURE) ×4 IMPLANT
SUT PROLENE 6 0 C 1 30 (SUTURE) ×4 IMPLANT
SUT SILK 2 0 (SUTURE) ×5
SUT SILK 2-0 18XBRD TIE 12 (SUTURE) IMPLANT
SUT SILK 3 0 (SUTURE) ×5
SUT SILK 3-0 18XBRD TIE 12 (SUTURE) IMPLANT
SUT SILK 4 0 (SUTURE) ×5
SUT SILK 4-0 18XBRD TIE 12 (SUTURE) IMPLANT
SUT VIC AB 2-0 CT1 27 (SUTURE)
SUT VIC AB 2-0 CT1 TAPERPNT 27 (SUTURE) IMPLANT
SUT VIC AB 3-0 SH 27 (SUTURE)
SUT VIC AB 3-0 SH 27X BRD (SUTURE) IMPLANT
SYR 10ML LL (SYRINGE) ×6 IMPLANT
SYR 20CC LL (SYRINGE) ×6 IMPLANT
SYR 30ML LL (SYRINGE) ×5 IMPLANT
SYR 3ML LL SCALE MARK (SYRINGE) ×2 IMPLANT
SYR 50ML LL SCALE MARK (SYRINGE) ×5 IMPLANT
SYR CONTROL 10ML LL (SYRINGE) ×2 IMPLANT
SYR MEDRAD MARK V 150ML (SYRINGE) ×2 IMPLANT
TOWEL GREEN STERILE (TOWEL DISPOSABLE) ×5 IMPLANT
TRAY FOLEY MTR SLVR 16FR STAT (SET/KITS/TRAYS/PACK) ×5 IMPLANT
TUBING HIGH PRESSURE 120CM (CONNECTOR) ×7 IMPLANT
WIRE AMPLATZ SS-J .035X180CM (WIRE) IMPLANT
WIRE BENTSON .035X145CM (WIRE) ×2 IMPLANT
WIRE STIFF LUNDERQUIST 260CM (WIRE) ×2 IMPLANT
WND VAC CANISTER 500ML (MISCELLANEOUS) ×2 IMPLANT

## 2018-06-14 NOTE — ED Notes (Signed)
Family at bedside. 

## 2018-06-14 NOTE — Care Management (Signed)
This is a no charge note  Pending admission per Dr. Manus Gunningancour  47 year old male with a past medical history of ESRD-HD, AAA, thoracic dissection repair, who presents with unresponsiveness and collapse.  Wife did CPR at home.  Patient is hypotensive.  Hemoglobin 7.3.  Bedside ultrasound showed free fluid in the abdomen, most likely due to ruptured AAA.  Family want comfort measures only. Patient is extubated. Will admit to MedSurg bed as inpatient for comfort measure only status.   Lorretta HarpXilin Keilan Nichol, MD  [facility] Pager [phone]  If 7PM-7AM, please contact night-coverage [email or web address] Password [email or web address] 02-18-2018, 6:56 AM

## 2018-06-14 NOTE — Consult Note (Signed)
Billy AbideKelsey Contreras Admit Date: 05/28/2018 06/10/2018 [name], [name] Requesting Physician:  Mariel CraftB Cain MD  Reason for Consult:  ESRD, s/p repair of ruptured thoracoabdominal aneurysm HPI:  Patient is intubated, sedated and unable to provide history.  No other family members available.  Following is from chart review and discussing with other providers.  47 year old male ESRD via tunneled dialysis catheter, unknown at the current time where he receives outpatient hemodialysis who presented early this morning found unresponsive and found to have a ruptured complex thoracoabdominal aneurysm.  Initially patient was placed on comfort measures but then patient awoke and opted for attempted repair.  He underwent endovascular repair of the aneurysm, bypass of the superior mesenteric artery and splenic artery, and right lower extremity thromboembolectomy.  He currently is on phenylephrine and norepinephrine.  Potassium is 6.0, hemoglobin 8.5.  ABG 7.235/36/467.  He has a right chest tunneled dialysis catheter.  Creatinine, Ser (mg/dL)  Date Value  29/56/213011/12/2017 10.34 (H)  05/23/2018 12.50 (H)  06/12/2018 12.70 (H)  ] I/Os:  ROS Balance of 12 systems is negative w/ exceptions as above  PMH  Past Medical History:  Diagnosis Date  . AAA (abdominal aortic aneurysm, ruptured) (HCC)   . ESRD (end stage renal disease) on dialysis (HCC)   . Hypertension    PSH  Past Surgical History:  Procedure Laterality Date  . AV FISTULA PLACEMENT    . CARDIAC SURGERY     FH History reviewed. No pertinent family history. SH  reports that he has never smoked. He has never used smokeless tobacco. He reports that he drank alcohol. He reports that he has current or past drug history. Allergies No Known Allergies Home medications Prior to Admission medications   Medication Sig Start Date End Date Taking? Authorizing Provider  UNABLE TO FIND Take 1 tablet by mouth daily. Med Name: Benzapril   Yes [provider]     Current Medications Scheduled Meds: . sodium chloride   Intravenous Once  . dextrose  1 ampule Intravenous Once  . [START ON 06/15/2018] insulin aspart  0-9 Units Subcutaneous Q4H  . insulin regular  10 Units Intravenous Once  . pantoprazole (PROTONIX) IV  40 mg Intravenous QHS   Continuous Infusions: .  prismasol BGK 4/2.5    .  prismasol BGK 4/2.5    . sodium chloride    . sodium chloride    . calcium chloride 1 g (05/29/2018 2250)  .  ceFAZolin (ANCEF) IV    . magnesium sulfate 1 - 4 g bolus IVPB    . norepinephrine    . phenylephrine (NEO-SYNEPHRINE) Adult infusion    . prismasol BGK 4/2.5    . propofol    . sodium chloride     PRN Meds:.sodium chloride, sodium chloride, acetaminophen **OR** acetaminophen, antiseptic oral rinse, bisacodyl, fentaNYL (SUBLIMAZE) injection, fentaNYL (SUBLIMAZE) injection, guaiFENesin-dextromethorphan, heparin, hydrALAZINE, labetalol, magnesium sulfate 1 - 4 g bolus IVPB, metoprolol tartrate, midazolam, midazolam, morphine injection, ondansetron, phenol, polyvinyl alcohol, sodium chloride  CBC Recent Labs  Lab 06/03/2018 0235  06/03/2018 1806 06/02/2018 1937 06/02/2018 1959 06/10/2018 2108  WBC 18.2*  --   --   --   --   --   NEUTROABS 15.4*  --   --   --   --   --   HGB 7.3*   < > 10.2*  --  8.2* 8.5*  HCT 24.7*   < > 30.0*  --  24.0* 25.0*  MCV 90.8  --   --   --   --   --  PLT 381  --   --  81*  --   --    < > = values in this interval not displayed.   Basic Metabolic Panel Recent Labs  Lab 05/26/2018 0227 06/02/2018 0235 05/17/2018 1541 06/04/2018 1655 06/01/2018 1806 05/26/2018 1959 05/22/2018 2108 06/09/2018 2224  NA 133* 136 139 137 135 141 143 144  K 5.4* 5.6* 5.5* 5.7* 7.3* 6.2* 6.0* 6.2*  CL 100 98  --   --   --   --   --  111  CO2  --  20*  --   --   --   --   --  14*  GLUCOSE 168* 174* 95  --   --   --   --  116*  BUN 57* 62*  --   --   --   --   --  58*  CREATININE 12.70* 12.50*  --   --   --   --   --  10.34*  CALCIUM  --  8.8*  --    --   --   --   --  7.8*    Physical Exam  Blood pressure 94/65, pulse (!) 112, temperature (!) 97.1 F (36.2 C), temperature source Temporal, resp. rate 18, height 5\' 9"  (1.753 m), weight 68 kg, SpO2 97 %. GEN: Intubated, sedated ENT: NCAT, ET tube in place EYES: Eyes closed CV: Tachicardic, regular, normal S1 and S2 PULM: Clear breath sounds anteriorly and bilaterally ABD: Wound VAC in place SKIN: No rashes or lesions, right IJ tunneled dialysis catheter bandaged EXT: No lower extremity edema   Assessment 47M ESRD with ruptured thoracoabdominal aneurysm s/p repair, now with shock on pressors, metabolic acidosis, hyperkalemia.  1. ESRD TDC, outpt HD info unknown 2. Ruptured thoracoabdominal aortic aneurysm s/p repair 3. Hyperkalemia 4. Metabolic Acidosis 5. Shock on PE/NE 6. VDRF  Plan 1. CRRT: start 4K, high flow settings, net even UF, no heparin 2. Will follow closely   Sabra Heck MD [phone] pgr 06/03/2018, 10:58 PM

## 2018-06-14 NOTE — ED Notes (Signed)
Paged Vascular Surg to [facility]

## 2018-06-14 NOTE — Progress Notes (Signed)
   06/03/2018 0200  Clinical Encounter Type  Visited With Patient;Family  Visit Type Initial  Referral From Nurse  Consult/Referral To Chaplain  Spiritual Encounters  Spiritual Needs Emotional;Prayer  Stress Factors  Patient Stress Factors None identified  Family Stress Factors Health changes;Major life changes  Responded to page from Nurse in ED. PT was unresponsive and family are in consultation room. After visiting PT and offering silent prayer with him, I visited family for support. After Doctor informed family of PT's health I offered spiritual care with words of encouragement, ministry of presence, and prayer. Chaplain available as needed.   Chaplain Orest DikesAbel Kari Montero  [phone]

## 2018-06-14 NOTE — Progress Notes (Signed)
I spoke with the patient and family at length regarding the complexity of the situation.  We discussed 3 options.  The first would be transferred to [facility] where a minimally invasive approach could be attempted.  The second would be palliative care.  The third would be staying here at [facility] for a hybrid repair of his ruptured thoracoabdominal aneurysm secondary to dissection.  The patient would like to have his repair done here.  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  The patient understands all of these and wants to have this procedure done today.  Durene CalWells Ezrah Dembeck

## 2018-06-14 NOTE — Procedures (Signed)
Central Venous Catheter Insertion Procedure Note [name] [id number] 02/23/1971  Procedure: Insertion of Central Venous Catheter Indications: Assessment of intravascular volume, Drug and/or fluid administration and Frequent blood sampling  Procedure Details Consent: Risks of procedure as well as the alternatives and risks of each were explained to the (patient/caregiver).  Consent for procedure obtained. Time Out: Verified patient identification, verified procedure, site/side was marked, verified correct patient position, special equipment/implants available, medications/allergies/relevent history reviewed, required imaging and test results available.  Performed  Maximum sterile technique was used including antiseptics, cap, gloves, gown, hand hygiene, mask and sheet. Skin prep: Chlorhexidine; local anesthetic administered A antimicrobial bonded/coated triple lumen catheter was placed in the left femoral vein due to patient being a dialysis patient using the Seldinger technique. Ultrasound guidance used.Yes.   Catheter placed to 20 cm. Blood aspirated via all 3 ports and then flushed x 3. Line sutured x 2 and dressing applied.  Evaluation Blood flow good Complications: No apparent complications Patient did tolerate procedure well. Chest X-ray ordered to verify placement.  CXR:not needed  [facility]  ACNP Ad[facility]ack[facility] PCCM Pager [phone] till 1 pm If no answer page [phone] 06/15/2018, 11:35 PM

## 2018-06-14 NOTE — ED Notes (Signed)
Comfort care measures; family at bedside

## 2018-06-14 NOTE — ED Notes (Signed)
RT aware of extubation per family request

## 2018-06-14 NOTE — Progress Notes (Signed)
Patient states he is not in pain decision was made not to hang the Dilaudid infusion.   Patient's family asking questions as to next step. Dr. Ophelia CharterYates paged- States she will send someone from hospice to meet with patient and family.

## 2018-06-14 NOTE — Progress Notes (Signed)
RT extubated pt. Per MD order. Pt. Placed on 2L nasal cannula.

## 2018-06-14 NOTE — Op Note (Signed)
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 Post-operative diagnosis:  Same Surgeon:  Durene Cal  Co-surgeon: Lemar Livings Assistants: Lelon Mast Ryne Procedure:   #1: Ultrasound-guided access, right femoral artery with percutaneous closure   #2: Intravascular ultrasound of the right external and common iliac artery, thoracic and abdominal aorta   #3: Right external iliac to superior mesenteric bypass graft with 8 mm ring PTFE graft and jump graft to the splenic artery with 8 mm PTFE   #4: Ligation of superior mesenteric artery and celiac artery   #5: Endovascular repair of thoracoabdominal aneurysm   #6: Temporary abdominal closure with wound VAC   #7: Open exposure of right femoral artery with right lower extremity and iliac thrombectomy   #8: Thoracic and abdominal aortogram   #8: Ligation of left renal vein Anesthesia: General Blood Loss:  1200 Specimens: None  Findings: Ruptured thoracoabdominal aneurysm.  This was repaired with deep branching of the superior mesenteric artery and celiac artery and placement of overlapping Gore CTAG devices from the patient's previous arch reconstruction down to the distal abdominal aorta.  The branching of the visceral vessels was performed via a right external iliac to superior mesenteric artery bypass graft with 8 mm ring PTFE and a jump graft to the splenic artery.  The celiac and superior mesenteric artery were ligated proximal to the bypass grafts.  There was complete exclusion of the aneurysm after repair.  The retroperitoneum was opened after the case and a significant amount of clotted blood was evacuated.  Devices used: GORE CTAG 31 x 20 x 2, 31 x 15  Indications: Patient presented to [facility] several days ago with a contained rupture of a 10 cm thoracoabdominal aneurysm secondary to dissection.  At that  time, it was felt that he would be best treated endovascularly which would need to be done at [facility].  He was transferred to [facility] however he left AMA 2 days later.  Earlier today he collapsed at home and stopped breathing.  CPR was initiated by his wife.  He was apneic upon arrival of EMS.  He was nasally intubated.  He was brought to the emergency department.  After discussions with Dr. Randie Heinz, palliative care was decided upon.  Terminal extubation was performed.  Patient regained consciousness, and decided that he wanted to proceed with repair at [facility] and did not want to return to [facility].  I had an extensive discussion with the family and patient.  He understands the high risk of mortality with his operation as well as the risk of paralysis.  Procedure:  The patient was identified in the holding area and taken to [facility] OR ROOM 16  The patient was then placed supine on the table.  The patient was prepped and draped in the usual sterile fashion.  A time out was called and antibiotics were administered.  1% lidocaine was used for local anesthesia.  The right common femoral artery was evaluated with ultrasound and found to be calcified but patent.  A skin nick was made and the artery was cannulated under ultrasound guidance with a micropuncture needle.  An 018 wire was advanced and the micropuncture sheath was placed.  Next, a Bentson wire was inserted.  The subcutaneous tract was dilated with an 8 [place] dilator.  Probe glide devices were deployed for pre-closure at the 11:00 and 1 o'clock position.  An 8 French sheath was placed Next,  I navigated a wire and catheter into the ascending aorta followed by placement of a Lunderquist double core wire.  Intravascular ultrasound was used to evaluate the iliac vessels as well as the abdominal and thoracic aorta.  This confirmed that the wire was in the true lumen.  Next attention was turned towards the abdomen.  A midline incision was made from the xiphoid to below the umbilicus.   The subcutaneous tissue was divided with cautery and the abdomen was entered sharply.  The incision was then extended throughout the length of the incision.  A Balfour and Omni-Tract were then inserted for exposure.  There was a large retroperitoneal mass present.  We then entered the lesser sac and dissected out a large calcified splenic artery measuring 1.5 cm.  Once this was exposed we proceeded proximally and identified a large hepatic artery as well as the left gastric artery.  We then dissected out the superior mesenteric artery within the transverse colon mesentery.  In order to get better exposure the renal vein was ligated between 2-0 silk ties.  We were ultimately able to dissect out the superior mesenteric artery.  During this time the patient became somewhat hypotensive and the retroperitoneal hematoma was expanding.  We then dissected out the right external iliac artery.  Once we had good exposure, the right external iliac artery was occluded.  A #11 blade was used to make an arteriotomy which was extended longitudinally with Potts scissors.  An 8 mm external ring Gore-Tex graft was then anastomosed to the external iliac artery with 6-0 Prolene.  There was excellent pulsatile flow through the graft.  The graft was then brought up in a seated configuration and an anastomosis using 6-0 Prolene was performed to the superior mesenteric artery.  A 2-0 silk tie was then used to ligate the superior mesenteric artery proximal to the bypass graft.  There were excellent Doppler signals within the superior mesenteric artery after ligation.  The superior mesenteric artery bypass graft was then occluded.  It was opened in its midportion with an 11 blade and a 5 mm punch.  A second 8 mm externally supported Gore-Tex graft was anastomosed to the bypass and then brought in a retropancreatic tunnel up to the splenic artery.  An end-to-side anastomosis was performed to the splenic artery.  This was with 6-0 Prolene.  We  then dissected more proximal on the celiac artery.  While trying to get circumferential control a rent was made in the posterior side of the celiac artery and therefore we elected to ligated at this level with a 2-0 silk tie.  There were brisk Doppler signals in the splenic and hepatic arteries.  Next, attention was turned towards the groin.  The 8 French sheath was removed.  A 20 French sheath was inserted, keeping it distal to the origin of the bypass graft off the right external iliac artery.  A thoracic aortogram was performed so that we could locate the position for the stent graft.  A 31 x 20 graft was deployed within the patient's previous arch reconstruction elephant trunk graft.  We then deployed a second 31 x 20 device.  An abdominal aortogram was then performed through a catheter coming through the 20 French sheath in the groin which located the aortic bifurcation.  A third Gore thoracic device was positioned just above the aortic bifurcation.  This was a 31 x 15 device.  It was then deployed.  A MOB balloon was used to mold the graft  throughout its length.  At this point, the patient had palpable femoral pulses.  Completion imaging was then performed.  This showed continued patency of the aortic arch deep branching grafts and successful exclusion of the aneurysm with no evidence of leak.  The bypass graft to the superior mesenteric artery and celiac artery were widely patent.  The 20 French sheath was removed and the pro-glide devices were secured for closure of the arteriotomy.  We then evaluated the feet for Doppler signals and were unable to get a Doppler signal in the right leg.  Next, the retroperitoneum was opened large amounts of hematoma were evacuated.  Because of the patient's ongoing coagulopathy we elected to place a temporary abdominal closure with a wound VAC.  Once this was done an oblique incision was made in the right groin to explore the right leg that did not have Doppler signals.  A  transverse arteriotomy was made in the common femoral artery.  Fogarty catheter was placed proximally and a small amount of thrombus was evacuated followed by excellent inflow.  Catheter was passed into the profundofemoral artery as well as the superficial femoral artery.  No clot was extracted from the superficial femoral profundofemoral artery.  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  We then closed the arteriotomy transversely with running 5-0 Prolene.  There was a defect in the crotch of the superficial femoral and profundofemoral artery that required suture ligation.  Patient had a palpable femoral pulse and brisk profundofemoral Doppler signals.  The femoral sheath was then reapproximated with 2-0 Vicryl and the skin was closed with staples.  Patient was then taken to the intensive care unit in critical condition   Disposition: To ICU in critical condition   V. Durene CalWells Ilina Xu, M.D. [facility] Office: [phone] Pager:  [phone]

## 2018-06-14 NOTE — Transfer of Care (Signed)
Immediate Anesthesia Transfer of Care Note  Patient: Atlee AbideKelsey Igarashi  Procedure(s) Performed: THORACIC AORTIC ENDOVASCULAR STENT GRAFT (N/A ) INTRAVASCULAR ULTRASOUND/IVUS (N/A ) MESENTERIC ARTERY BYPASS with propaten vascular graft THROMBECTOMY FEMORAL ARTERY (Right )  Patient Location: SICU  Anesthesia Type:General  Level of Consciousness: Patient remains intubated per anesthesia plan  Airway & Oxygen Therapy: Patient placed on Ventilator (see vital sign flow sheet for setting)  Post-op Assessment: Report given to RN and Post -op Vital signs reviewed and stable  Post vital signs: Reviewed and stable  Last Vitals:  Vitals Value Taken Time  BP 103/62 10/19/17 10:15 PM  Temp    Pulse    Resp 12 10/19/17 10:23 PM  SpO2    Vitals shown include unvalidated device data.  Last Pain:  Vitals:   May 04, 2018 1353  TempSrc:   PainSc: 0-No pain      Patients Stated Pain Goal: 3 (May 04, 2018 1353)  Complications: No apparent anesthesia complications

## 2018-06-14 NOTE — H&P (Signed)
History and Physical    [name] ZOX:[id number] DOB: 12/03/1970 DOA: 06/03/2018  PCP: Patient, No Pcp Per Consultants:  Kathrene Bongo - Nephrology; Brabham/Owens - vascular Patient coming from:  Home - lives with wife   Chief Complaint: respiratory arrest  HPI: [name] is a 47 y.o. male with medical history significant of ESRD on HD and AAA presenting with respiratory arrest.  He presented on 11/22 due to large abdominal aortic aneurysm with probable contained rupture.  Vascular surgery was consulted and transfer to [facility] was  recommended and arranged.  He was counseled on options and opted for minimally invasive repair: "Would require 2 stage FEVAR. Risks include: cardiac 5-10 %, pulmonary 10 %, renal 100 %, gastrointestinal blood flow 1-2 %, bilateral lower extremity blood flow 1-2 %. Average hospital stay of 2-3 days, average overall recovery 4-6 weeks. Follow-up: 1 month, 3 month, 6 month, 9 month, 1 year, 1.5 years, 2 years and then yearly thereafter. He was informed that it takes a minimum of 3 months to get the device in and that the device is not FDA approved and is part of a study. There is a 10% chance he'll need an additional minimally invasive procedure in the 3 years following his repair. [name] states that he is interested in pursuing the CMD route, but would like to meet with Dr. Pattricia Boss prior to making any decisions. Will plan for patient to be seen in clinic with his wife on 06/20/18."  He then signed out AMA on 11/24.   He collapsed at home and was intubated and unresponsive on arrival.  He was started on epinephrine.  Bedside US showed abdominal free fluid c/w aneurysm rupture.  He was seen by Dr. Randie Heinz, who suggested that this would be a complex open repair "with high risk of intraoperative mortality, blood loss requiring transfusion, spinal cord ischemia, and a low risk to returning to his baseline status."  Based on this information, the decision was made by the family  to withdraw life-sustaining measures and proceed with comfort care.   ED Course: ESRD patient with h/o AAA repair.  New large dissection and AAA - treated at [facility] and left AMA.  Collapsed yesterday.  Dr. Randie Heinz evaluated.  Comfort care, extubated.  Family reasonable, understands he is dying.    Review of Systems: Unable to perform     Past Medical History:  Diagnosis Date  . AAA (abdominal aortic aneurysm, ruptured) (HCC)   . ESRD (end stage renal disease) on dialysis (HCC)   . Hypertension     Past Surgical History:  Procedure Laterality Date  . AV FISTULA PLACEMENT    . CARDIAC SURGERY      Social History   Socioeconomic History  . Marital status: Married    Spouse name: Not on file  . Number of children: Not on file  . Years of education: Not on file  . Highest education level: Not on file  Occupational History  . Not on file  Social Needs  . Financial resource strain: Not on file  . Food insecurity:    Worry: Not on file    Inability: Not on file  . Transportation needs:    Medical: Not on file    Non-medical: Not on file  Tobacco Use  . Smoking status: Never Smoker  . Smokeless tobacco: Never Used  Substance and Sexual Activity  . Alcohol use: Not Currently  . Drug use: Not Currently  . Sexual activity: Not on file  Lifestyle  .  Physical activity:    Days per week: Not on file    Minutes per session: Not on file  . Stress: Not on file  Relationships  . Social connections:    Talks on phone: Not on file    Gets together: Not on file    Attends religious service: Not on file    Active member of club or organization: Not on file    Attends meetings of clubs or organizations: Not on file    Relationship status: Not on file  . Intimate partner violence:    Fear of current or ex partner: Not on file    Emotionally abused: Not on file    Physically abused: Not on file    Forced sexual activity: Not on file  Other Topics Concern  . Not on file  Social  History Narrative  . Not on file    Allergies not on file  No family history on file.  Prior to Admission medications   Not on File    Physical Exam: Vitals:   05/22/2018 0436 05/21/2018 0640 05/21/2018 0745 06/13/2018 0822  BP: (!) 65/51 (!) 77/58 (!) 81/55 94/65  Pulse: (!) 125 (!) 107 (!) 109 (!) 112  Resp: 18 11 18 18   Temp:      TempSrc:      SpO2: (!) 88% 91% 97% 97%     General:  On initial exam was comatose with periods of apnea; on repeat exam was awake and alert Eyes:   EOMI, normal lids, iris on 2nd exam ENT:  grossly normal hearing, lips & tongue, mmm on 2nd exam Neck:  no LAD, masses or thyromegaly; dialysis catheter in right chest Cardiovascular:  RRR, no m/r/g. No LE edema.  Respiratory: On initial exam, CTA with periods of apnea;  On 2nd exam, CTA bilaterally with no wheezes/rales/rhonchi.  Normal respiratory effort. Abdomen:  Mildly distended, pulsatile mass noted Skin:  no rash or induration seen on limited exam Musculoskeletal: grossly normal tone BUE/BLE, no bony abnormality Lower extremity:  No LE edema.  Limited foot exam with no ulcerations.  2+ distal pulses. Psychiatric: initially comatose; on subsequent exam, alert and oriented and appears to understand the situation Neurologic: unable to perform    Radiological Exams on Admission: Dg Chest Portable 1 View  Result Date: 05/24/2018 CLINICAL DATA:  Endotracheal tube adjustment. EXAM: PORTABLE CHEST 1 VIEW COMPARISON:  Radiograph earlier this day FINDINGS: Endotracheal tube tip has been adjusted and is now seen 8.6 cm from the carina just above the thoracic inlet. Enteric tube in place with tip and side-port below the diaphragm not included in the field of view. Right dialysis catheter tip at the atrial caval junction. Patient is post median sternotomy. Mild cardiomegaly with postsurgical change in the upper mediastinum. No pneumothorax. Dense retrocardiac opacity. Suspect small bilateral pleural effusions.  No pulmonary edema. IMPRESSION: 1. Endotracheal tube tip 8.6 cm from the carina. Enteric tube in place with tip and side-port below the diaphragm not included in the field of view. 2. Otherwise unchanged exam. Retrocardiac opacity with small bilateral pleural effusions and postsurgical change. Electronically Signed   By: Narda RutherfordMelanie  Sanford M.D.   On: 06/15/2018 03:03   Dg Chest Portable 1 View  Result Date: 05/31/2018 CLINICAL DATA:  Endotracheal tube and OG tube placement. EXAM: PORTABLE CHEST 1 VIEW COMPARISON:  Subsequent exam. Chest CTA 06/07/2018. FINDINGS: Endotracheal tube tip is tentatively identified high in positioning above the thoracic inlet, this has been subsequently advanced. Enteric tube  in place with tip below the diaphragm not included in the field of view. Right dialysis catheter tip at the atrial caval junction. Patient is post median sternotomy. Mild cardiomegaly with postsurgical change in the upper mediastinum. No pneumothorax. Dense retrocardiac opacity. Suspect small bilateral pleural effusions. No pulmonary edema. IMPRESSION: 1. Endotracheal tube tip tentatively identified above the thoracic inlet, this has been subsequently advanced. Enteric tube in place with tip below the diaphragm not included in the field of view. 2. Dense retrocardiac opacity, likely atelectasis. Suspect small pleural effusions. 3. Cardiomegaly with postsurgical change in the mediastinum. Electronically Signed   By: Narda Rutherford M.D.   On: 2018/06/19 03:01    EKG: Independently reviewed.  Junctional tachycardia with rate 120; nonspecific ST changes that are likely rate related; QTc 553   Labs on Admission: I have personally reviewed the available labs and imaging studies at the time of the admission.  Pertinent labs:   K+ 5.6 CO2 20 Glucose 174 BUN 62/Creatinine 12.50/GFR 5 Anion gap 18 Albumin 2.3 Lactate 5.10 WBC 18.2 Hgb 7.3 INR 1.50 ABG: 7.364/34/381  Assessment/Plan Active  Problems:   Hemorrhagic shock (HCC)   Comfort measures only status   ESRD (end stage renal disease) on dialysis (HCC)   Hypertension   AAA (abdominal aortic aneurysm, ruptured) (HCC)   -Patient presented in respiratory arrest with hemorrhagic shock after apparent AAA rupture -Dr. Randie Heinz consulted and the patient was determined to have an extremely high morbidity/mortality risk regardless of intervention and so the decision was made to proceed with comfort measures -The patient was extubated in the ER and pressors were stopped -After he was transported to the palliative floor, he regained consciousness and has stabilized from a hemodynamic standpoint -This appears to be transient, but allowed the patient and his family to reconsider their options -I called and spoke with Dr. Randie Heinz, who reiterated the extremely high morbidity/mortality risk in this situation regardless of intervention.  While he did offer the option of surgical repair attempt at this facility, the patient would clearly be more appropriately served at [facility] if he prefers surgical repair. -I then went back up to speak with the family.  At that time, the patient was alert and oriented and clearly appeared to understand the situation.  As such, I counseled him about the options to include comfort care here; attempt at surgical repair here with suboptimal resources and very high risk of death from the procedure; and transfer to [facility] for possible surgery if they will accept him and despite an ongoing extremely high surgical risk. -The patient requested time to think about it and consider his decision.  I pointed out that if he does not make a decision himself soon, the decision may be made for him. -The nurse eventually called back and said that the patient wishes to proceed with surgery here. -I then called vascular surgery back to inform them of this decision.  Dr. Myra Gianotti wishes to confer with Dr. Randie Heinz and the patient to decide if this is even  an option. -Will continue current management for now - comfort measures only - until a clear decision is made, since the patient has an extremely poor outcome regardless of which path he chooses. -In review of Dr. Estanislado Spire most recent note, the patient is electing to stay here for surgery, which will be performed today.  For now, will leave on comfort measures although he is very likely to require ICU post-operative care if he survives.  DVT prophylaxis: None for now, likely SCDs post-op if needed Code Status: DNR - confirmed with family Family Communication:  Many family members present throughout evaluations Disposition Plan: Likely to have an in-hospital death Consults called: Vascular Admission status: Admit - It is my clinical opinion that admission to INPATIENT is reasonable and necessary because of the expectation that this patient will require hospital care that crosses at least 2 midnights to treat this condition based on the medical complexity of the problems presented.  Given the aforementioned information, the predictability of an adverse outcome is felt to be significant.    Jonah Blue MD [facility]  If note is complete, please contact covering daytime or nighttime physician. [email or web address] Password [facility]  06-30-2018, 12:49 PM

## 2018-06-14 NOTE — Progress Notes (Signed)
Received pt from ED, was told in report patient had been unconscious. When transferring patient from stretcher to bed patient became alert was able to say his full name, DOB and [facility]   Family at bedside. Confirmed with wife patient will remain a DNR.

## 2018-06-14 NOTE — Consult Note (Signed)
ED Consult    Reason for Consult:  Ruptured AAA Referring Physician:  Dr. Manus Gunningancour MRN #:  [id number]  History of Present Illness: This is a 47 y.o. male with history of end-stage renal disease was previously evaluated and transferred to [facility] for complex thoracoabdominal aneurysm 1 week ago.  He now presents via EMS after collapsing at home.  Patient was unresponsive for EMS was intubated.  Time of my exam he is intubated requiring epinephrine for blood pressure.  Family is available in the waiting room.  No history could be obtained from patient.  Past medical history: End-stage renal disease Past surgical history: Aortic arch reconstruction Family history: Noncontributory Social history: Could not be obtained  ROS: Not obtained secondary to patient's state  Physical Examination  Vitals:   06/05/2018 0257 05/20/2018 0301  BP: (!) 83/58 (!) 83/57  Pulse:  99  Resp: 14 20  Temp:    SpO2:  90%   There is no height or weight on file to calculate BMI.  General: Intubated requiring epinephrine HENT: Nasotracheal tube in place Cardiac: Weakly palpable bilateral common femoral pulses Abdomen: Distended with pulsatile mass extending to the left flank Extremities: Appear well-perfused Musculoskeletal: no muscle wasting or atrophy  Neurologic: Not responding to verbal stimuli  CBC    Component Value Date/Time   WBC 18.2 (H) 06/13/2018 0235   RBC 2.72 (L) 05/30/2018 0235   HGB 7.3 (L) 05/21/2018 0235   HCT 24.7 (L) 06/15/2018 0235   PLT 381 05/23/2018 0235   MCV 90.8 05/27/2018 0235   MCH 26.8 06/15/2018 0235   MCHC 29.6 (L) 05/22/2018 0235   RDW 17.4 (H) 06/05/2018 0235   LYMPHSABS 1.0 05/18/2018 0235   MONOABS 1.6 (H) 05/26/2018 0235   EOSABS 0.0 06/13/2018 0235   BASOSABS 0.0 06/05/2018 0235    BMET    Component Value Date/Time   NA 133 (L) 05/23/2018 0227   K 5.4 (H) 06/01/2018 0227   CL 100 05/19/2018 0227   GLUCOSE 168 (H) 05/30/2018 0227   BUN 57 (H)  05/18/2018 0227   CREATININE 12.70 (H) 05/26/2018 0227    COAGS: Lab Results  Component Value Date   INR 1.50 05/17/2018     Non-Invasive Vascular Imaging:   I reviewed his previous CT scan which demonstrates a nearly 10 cm aneurysm which is in the abdominal component of a thoracoabdominal aneurysm the 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  His celiac and SMA arteries were patent at the time of the previous CT scan.  Renal arteries do not appear patent.  Bedside ultrasound performed by Dr. Manus Gunningancour suggest abdominal free fluid consistent with aneurysm rupture.   ASSESSMENT/PLAN: This is a 47 y.o. male presenting after syncopal event with known large thoracoabdominal aneurysm now with abdominal free fluid to suggest rupture patient requiring epinephrine to maintain marginal blood pressures and has been transfused as well.  I discussed with the family that this would be a complex repair (requiring both endovascular stenting of his aorta from the thoracic to the abdominal with bifurcated device standard evar the distal portion and then likely open bypass to the SMA possible celiac arteries) with high risk of intraoperative mortality, blood loss requiring transfusion, spinal cord ischemia, and a low risk to returning to his baseline status.  Family includes wife, mother and close family friends.  Wife tells me that patient would not want this operation and so he will be transition to comfort care.    Apolinar JunesBrandon  C. Donzetta Matters, MD [facility] [place] Office: [phone] Pager: [phone]

## 2018-06-14 NOTE — Op Note (Signed)
Patient name: [name] MRN: [id number] DOB: 09/01/1970 Sex: male  2017-11-22 Pre-operative Diagnosis: Ruptured thoracoabdominal aneurysm Post-operative diagnosis:  Same Surgeons:  Apolinar JunesBrandon C. Randie Heinzain, MD; Durene CalWells Brabham, MD Assistant: Doreatha MassedSamantha Rhyne, PA Procedure Performed: 1.  Ultrasound-guided cannulation of right common femoral artery and percutaneous closure 2.  Intravascular ultrasound of right external and common iliac arteries and thoracic and abdominal aorta 3.  Right external iliac artery to SMA bypass with 8 mm PTFE and jump graft to splenic artery with 8 mm PTFE 4.  Ligation of SMA and celiac arteries 5.  Endovascular repair of thoracoabdominal aneurysm with 31 x 31 x 20 cm, 31 x 31 x 20 cm and 31 x 31 x 15 cm Gore TAG 6.  Temporary abdominal closure of abdomen with AB Thera wound VAC 7.  Exposure of right common femoral artery and right lower extremity thromboembolectomy   Indications: 47 year old male with a known thoracoabdominal aneurysm that was symptomatic a week prior to this now presented with rupture.  Initially the patient was unresponsive and hypotensive family elected for palliative care.  Patient then awakened and extubated and was conversive and elected to undergo an operation understanding extreme risk that he would incur.  He was subsequently consented for the above operation.  Findings: There is a large retroperitoneal mass in the left side elevating the left colon anteriorly.  There was a soft area in the right external iliac artery for takeoff of a bypass to the SMA which was also soft however did have significant soft plaque throughout.  Splenic artery was heavily calcified we did find one artery to bypass to in a jump graft configuration from the right external iliac artery to the SMA.  After stenting from the previous elephant trunk down to the level of the aortic bifurcation we then had flow throughout our stent grafts and filling of her SMA and celiac artery in  a retrograde fashion.  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  I completion the patient remained critically ill on 80 mcg/min of Neo-Synephrine and 8 mcg/min of levo fed and was transferred to the ICU intubated.   Procedure:  The patient was identified in the holding area and taken to the operating room where he was placed supine on the operating table.  Given the unstable nature of his thoracoabdominal aneurysm we elected to first use ultrasound guidance to cannulate his right common femoral artery with micropuncture needle followed by wire sheath.  We placed a Bentson wire perform percutaneous closure and then placed an 8 French sheath.  We then used a Bentson wire to traverse into the thoracic aorta we are into the a sending aorta we exchanged for a long wire and then performed intravascular ultrasound from the external iliac artery up into the common iliac artery on the right and then the entire abdominal and thoracic aorta which demonstrated that we were in the true lumen which was relatively collapsed.  This time we then were satisfied that we could get a balloon in place for proximal control if needed.  General anesthesia was then induced the patient was given antibiotics.  A midline abdominal incision was made from the xiphoid to the pubis.  We encountered a large retroperitoneal hematoma that throughout the case did expand and this was on the left side..  We first turned our attention to the celiac artery.  We divided the lesser sac identified our splenic artery as well as our common hepatic artery and celiac trunk and we placed a vessel loop around the splenic artery with a plan for bypass later.  We also identified the cephalad aspect of our pancreas attempted to protect this throughout its course.  We then turned our attention to the  SMA.  The transverse colon was retracted cephalad and the bowel retracted to the right.  Ligament of Treitz was divided.  We dissected down we did divide our renal vein between ties.  Anterior to this we identified our superior mesenteric artery which did appear soft and we placed Vesseloops around this.  We had planned to bypass from the left external iliac artery however given the large left-sided retroperitoneal hematoma we were forced to go to the right side.  This was unfortunately in the side with the sheath and wire in it.  We dissected down identified the ureter and protected this identified her hypogastric artery and distal to this our external iliac artery was actually quite soft for bypass.  We then placed Vesseloops around the external iliac artery and 2 places.  We did not heparinized the patient given the left-sided retroperitoneal hematoma that was expansile.  We did serially flushed our sheath in the right groin throughout the case though.  We then brought a millimeter PTFE to the table clamped our external iliac artery and sewed this and to side with 6-0 Prolene suture.  After completion we then flushed through the graft.  We created a gentle C curve loop up to the SMA.  The SMA was retracted and exposed.  We clamped approximately distally opened it longitudinally.  There was significant soft plaque throughout.  The graft was trimmed in size and sewed end to side with 6-0 Prolene suture.  Prior to completion anastomosis we then allowed flushing in all directions.  Upon completion there was good signal in the SMA.  We then used a piece of the 8 mm PTFE tunnel retropancreatic.  We then clamped our graft made a graftotomy on the side with an aortic punch.  We beveled the jump graft and sewed it into side with 6-0 Prolene.  We then identified our splenic artery in the lesser sac.  This was clamped proximally and distally and opened in a soft area however there was some calcium there.  We trimmed the  graft to size to match and then sewed it into side as well with 6-0 Prolene suture.  I completion we did have a good signal in our SMA and celiac artery with compression of both of the native arteries.  We then tied off both the native celiac and SMA arteries with 0 silk suture.  We then turned our attention to the endovascular portion of the case.  The previous wire that was through the true lumen remained in place.  We exchanged for a pigtail catheter performed aortogram.  We then brought a 31 x 31 x 20 graft to place and deployed it.  We subsequently deployed another 31 x 31 x 20 graft distally.  We then performed another aortogram which demonstrated our aortic bifurcation and brought a 31 x 31 x 15 graft to place and deployed this.  All of these pieces were ballooned despite the fact that this was dissection pathology is a sending aorta had been replaced in the graft did not fully open because of the small true lumen.  At completion angios we had flow throughout our grafts  and we felt her SMA and celiac arteries via a retrograde bypasses.  Satisfied with this we then deployed our closure device in the right groin.  We could not identify any signals in the right foot or ankle but did have a left-sided posterior tibial signal.  With this a transverse incision was made in the right groin.  Concomitantly we irrigated the abdomen and packed it with 4 laps one in the right lower quadrant to in the retroperitoneum where we had explored the hematoma and removed significant clot.  We also placed the lap up around the celiac artery bypass.  And if there are wound VAC was placed on the abdomen.  After identifying the common femoral artery profunda femoris artery and SFA we then made a transverse arteriotomy.  With very strong inflow did pass a Fogarty proximally.  We then attempted to pass a Fogarty down the SFA but it was heavily calcified consistent with chronic occlusion.  We passed the Fogarty down the profunda did not  return any clot.  We irrigated the lumen with heparinized saline and closed with a 5-0 Prolene suture.  There was one area of injury that did have to be pledget repaired with 5 oh as well.  At this time patient had been in the operating room for several hours.  He remained on pressors and was being transfused FFP and blood products.  We did give DDAVP after all of this and began the transfusion of FFP after restoring flow to the right lower extremity.  We closed the fascia in the groin with 2-0 Vicryl suture and staples were placed in the level of the skin.  He was then transferred to the ICU in critical condition on 2 vasopressor agents.  All counts were correct at completion with 4 lap pads in the abdomen.  Fluids administered: 8 units packed red blood cells, 2 units FFP, 20 units DDAVP, 450 cc Cell Saver, 3600 cc crystalloid  EBL: 1200 cc  4 lap pads retained in the abdomen.  Vickie Ponds C. Randie Heinz, MD [facility] [place] Office: [phone] Pager: [phone]

## 2018-06-14 NOTE — Consult Note (Addendum)
NAME:  [name], MRN:  [id number], DOB:  [birth year], LOS: 0 ADMISSION DATE:  06/15/2018, CONSULTATION DATE: 2018/06/15 REFERRING MD: Vascular surgery, CHIEF COMPLAINT: Post thoracoabdominal aneurysm repair with vent dependent respiratory failure in the setting of end-stage renal disease  Brief History   47 year old end-stage renal disease with history of noncompliance.  History of AAA repair in the past now with secondary AAA repair on 2018-06-15.  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  He signed out AMA from [facility] went home and had a respiratory arrest while at home.  He was admitted to [facility] hospital and was made a DNR initially.  Reportedly he woke up and wanted surgery and therefore was taken to the operating room and had a repeat thoracoabdominal aneurysm repair.  He is coagulopathic, on pressor support, multiple blood transfusions metabolic acidosis, hyperkalemia and pulmonary critical care was asked to manage his care as of June 15, 2018  Past Medical History  End-stage renal disease Noncompliant History of AAA repair  Significant Hospital Events     Consults:  2018/06/15 pulmonary critical care  Procedures:  Repeat thoracal abdominal aneurysm repair Jun 15, 2018 Tunneled right IJ hemodialysis catheter Right radial A-line Wound VAC to abdomen 06/15/2018  Significant Diagnostic Tests:    Micro Data:    Antimicrobials:    Interim history/subjective:  Just returned from operating room after having second AAA repair  Objective   Blood pressure 94/65, pulse (!) 112, temperature (!) 97.1 F (36.2 C), temperature source Temporal, resp. rate 18, height 5\' 9"  (1.753 m), weight 68 kg, SpO2 97 %.    Vent Mode: PRVC FiO2 (%):  [100 %] 100 % Set Rate:  [18 bmp] 18 bmp Vt Set:  [550 mL] 550 mL PEEP:  [5 cmH20] 5 cmH20 Plateau Pressure:  [15 cmH20-18 cmH20] 18 cmH20    Intake/Output Summary (Last 24 hours) at 06-15-2018 2150 Last data filed at 2018-06-15 2146 Gross per 24 hour  Intake 9487 ml  Output 1515 ml  Net 7972 ml   Filed Weights   06-15-18 1353  Weight: 68 kg    Examination: General: On nurse well-developed male, currently intubated sedated HENT: Endotracheal tube in place.  Pupils equal and reactive light at 3 mm Lungs: Coarse rhonchi bilaterally Cardiovascular: Sounds are regular with a sinus tach 118, currently on Levophed and Neo-Synephrine Abdomen: Open wound with wound VAC in place, Extremities: Left lower extremity appears warm of the right lower extremity, right femoral dressing noted Neuro: Currently unresponsive to noxious stimuli GU: Negative  Resolved Hospital Problem list     Assessment & Plan:  Vent dependent respiratory failure secondary to respiratory arrest while at home. Status post AAA repair Ventilator bundle adjustments as needed Bronchodilators as needed Serial chest x-rays while intubated Arterial blood gas on arrival from the operating room   Poorly responsive If he does not awaken may need a CT of the head near future Given intermittent IV sedation for tube tolerance to minimize sedation Neurochecks  Shock status post thoraco-abdominal aneurysm, AAA repair 15-Jun-2018 with extensive blood loss and lactic acidosis Pressor support as needed Transfuse per protocol Note he had been a DNR Check twelve-lead for completeness Check troponin for completeness  Blood loss anemia Serial CBC Transfusion per protocol Correct coagulopathic state   Chronic renal failure Currently has a tunneled right hemodialysis catheter in place Hyperkalemia We will need CRRT due to shock state Tunneled catheter  is in place Nephrology consult 10 units of insulin given x1 D50 1 amp given x1\ Calcium chloride 1 g given x1  Hyperglycemia Sliding scale insulin protocol       Best practice:  Diet:  N.p.o. Pain/Anxiety/Delirium protocol (if indicated): Today should be tube tolerance VAP protocol (if indicated): In place DVT prophylaxis: Currently self anticoagulated GI prophylaxis: PPI Glucose control: Sliding scale insulin Mobility: Bedrest Code Status: He was a DNR prior to surgery will clarify on arrival from our Family Communication: 05/29/2018 no family at bedside at this time Disposition: Come from the operating room status post AAA repair will be in the intensive care unit.  Labs   CBC: Recent Labs  Lab 06/09/2018 0227 05/23/2018 0235 06/15/2018 1937  WBC  --  18.2*  --   NEUTROABS  --  15.4*  --   HGB 8.2* 7.3*  --   HCT 24.0* 24.7*  --   MCV  --  90.8  --   PLT  --  381 81*    Basic Metabolic Panel: Recent Labs  Lab 06/10/2018 0227 05/28/2018 0235  NA 133* 136  K 5.4* 5.6*  CL 100 98  CO2  --  20*  GLUCOSE 168* 174*  BUN 57* 62*  CREATININE 12.70* 12.50*  CALCIUM  --  8.8*   GFR: Estimated Creatinine Clearance: 6.9 mL/min (A) (by C-G formula based on SCr of 12.5 mg/dL (H)). Recent Labs  Lab 06/13/2018 0228 05/24/2018 0235  WBC  --  18.2*  LATICACIDVEN 5.10*  --     Liver Function Tests: Recent Labs  Lab 05/27/2018 0235  AST 18  ALT 15  ALKPHOS 48  BILITOT 0.8  PROT 5.6*  ALBUMIN 2.3*   No results for input(s): LIPASE, AMYLASE in the last 168 hours. No results for input(s): AMMONIA in the last 168 hours.  ABG    Component Value Date/Time   PHART 7.364 05/23/2018 0236   PCO2ART 33.8 06/09/2018 0236   PO2ART 381.0 (H) 06/07/2018 0236   HCO3 19.5 (L) 05/30/2018 0236   TCO2 21 (L) 05/19/2018 0236   ACIDBASEDEF 6.0 (H) 06/10/2018 0236   O2SAT 100.0 05/27/2018 0236     Coagulation Profile: Recent Labs  Lab 06/09/2018 0235 05/30/2018 1937  INR 1.50 3.07    Cardiac Enzymes: No results for input(s): CKTOTAL, CKMB, CKMBINDEX, TROPONINI in the last 168 hours.  HbA1C: No results found for: HGBA1C  CBG: Recent Labs  Lab 06/10/2018 0200  GLUCAP  166*    Review of Systems:   Not available secondary to intubation.  Past Medical History  He,  has a past medical history of AAA (abdominal aortic aneurysm, ruptured) (HCC), ESRD (end stage renal disease) on dialysis (HCC), and Hypertension.  Past medical history is gleaned from chart as he is unable to give past medical history.  Surgical History    Past Surgical History:  Procedure Laterality Date  . AV FISTULA PLACEMENT    . CARDIAC SURGERY       Social History   reports that he has never smoked. He has never used smokeless tobacco. He reports that he drank alcohol. He reports that he has current or past drug history.   Family History   His family history is not on file.   Allergies No Known Allergies   Home Medications  Prior to Admission medications   Medication Sig Start Date End Date Taking? Authorizing Provider  UNABLE TO FIND Take 1 tablet by mouth daily. Med Name: Benzapril  Yes [provider]     Critical care time: 65 min per App      Brett CanalesSteve Minor ACNP [facility] Pager [phone] till 1 pm If no answer page [phone] 06/13/2018, 9:51 PM   Patient seen and examined, agree with above note.  I dictated the care and orders written for this patient under my direction. 40 mins of critical care time spent.  Mancel Parsonsosario, Luis R, MD [phone]

## 2018-06-14 NOTE — ED Notes (Signed)
Contacted Blood bank for Emergency Release Blood, per Teodora MediciBrittany O RN

## 2018-06-14 NOTE — Anesthesia Preprocedure Evaluation (Addendum)
Anesthesia Evaluation  Patient identified by MRN, date of birth, ID band Patient awake    Reviewed: Allergy & Precautions, NPO status , Patient's Chart, lab work & pertinent test results  Airway Mallampati: II  TM Distance: >3 FB     Dental   Pulmonary neg pulmonary ROS,    breath sounds clear to auscultation       Cardiovascular hypertension,  Rhythm:Regular Rate:Normal     Neuro/Psych    GI/Hepatic negative GI ROS, Neg liver ROS,   Endo/Other    Renal/GU Renal disease     Musculoskeletal   Abdominal   Peds  Hematology   Anesthesia Other Findings   Reproductive/Obstetrics                             Anesthesia Physical Anesthesia Plan  ASA: IV  Anesthesia Plan: General   Post-op Pain Management:    Induction: Intravenous, Rapid sequence and Cricoid pressure planned  PONV Risk Score and Plan: Treatment may vary due to age or medical condition, Ondansetron, Dexamethasone and Midazolam  Airway Management Planned: Oral ETT  Additional Equipment: Arterial line, PA Cath and Ultrasound Guidance Line Placement  Intra-op Plan:   Post-operative Plan: Possible Post-op intubation/ventilation  Informed Consent: I have reviewed the patients History and Physical, chart, labs and discussed the procedure including the risks, benefits and alternatives for the proposed anesthesia with the patient or authorized representative who has indicated his/her understanding and acceptance.   Dental advisory given  Plan Discussed with: CRNA, Anesthesiologist and Surgeon  Anesthesia Plan Comments:       Anesthesia Quick Evaluation

## 2018-06-14 NOTE — Progress Notes (Signed)
PCCM Progress Note  Called for ICU admission in this pt who presented with hemorrhagic shock and concern for ruptured AAA ([facility] of [facility], just left AMA a few days ago).  Dr. Randie Heinzain with vascular surgery was emergently consulted.  He discussed the case with pt's family including wife who has opted for no surgical interventions given extremely high risk of death.  Wife has opted for full comfort measures and withdrawal of life sustaining support in the ED.  Orders placed and emotional support offered to wife and other family at bedside.  Pt is expected to expire rather quickly given profound shock.   Rutherford Guysahul Desai, [place] - [facility] [place] [facility] Pager: [phone]  or [phone] 06/07/2018, 3:11 AM

## 2018-06-14 NOTE — ED Triage Notes (Addendum)
Pt started had acute onset of SOB and collapse at home with wife, stopped breathing, wife was instructed by dispatch to start CPR. Pt was moaning on fire arrival. Unsure of actual loss of pulses at home. EMS reports episodes of apnea and loss of consciousness. Alert at one point with EMS and able to state his name; currently unresponsive. Nasal intubation for 6.0; received 200mcg Fentanyl and 15mg  Versed enroute. Epi gtt started for initial bp 60s systolic. 16g L EJ. Unable to obtain manual bp on arrival. Dopplered pressure of 44/0  Pt [facility] from [facility] for [facility]

## 2018-06-14 NOTE — ED Notes (Signed)
Vascular and CCM at bedside  

## 2018-06-14 NOTE — ED Provider Notes (Signed)
[facility] EMERGENCY DEPARTMENT Provider Note   CSN: [id number] Arrival date & time: 07-12-18  0153     History   Chief Complaint Chief Complaint  Patient presents with  . Respiratory Arrest    HPI [name] is a 47 y.o. male.  Level 5 caveat for acuity of condition.  Patient brought in by EMS after suffering collapse and respiratory arrest at home.  Patient has a history of dialysis as well as recently diagnosed abdominal aortic aneurysm. 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   EMS reports he left AMA from [facility] several days ago.  He apparently collapsed at home witnessed by his wife.  No pulses were lost.  Wife reported that he stopped breathing and patient was having episodes of apnea with unresponsiveness for EMS.  He was intubated by EMS nasally and received fentanyl and Versed for sedation.  He started an epinephrine drip for initial blood pressures in the 60s.  Patient's family has arrived.  Patient's wife states that he called out for her with sudden trouble breathing.  She states his eyes were rolling back in his head and he was slow to respond.  He then stopped breathing and she called 911 and started CPR.  Patient's last dialysis session was November 26 on holiday schedule.  She denies any missed sessions.  The history is provided by the EMS personnel and a relative. The history is limited by the condition of the patient.    No past medical history on file.  Patient Active Problem List   Diagnosis Date Noted  . Hemorrhagic shock (HCC) 07/12/18          Home Medications    Prior to Admission medications   Not on File    Family History No family history on file.  Social History Social History   Tobacco Use  . Smoking status: Not on file  Substance Use Topics  . Alcohol use: Not on file  . Drug use: Not on file     Allergies   Patient has no allergy information on record.   Review of Systems Review of Systems    Unable to perform ROS: Acuity of condition     Physical Exam Updated Vital Signs BP (!) 88/62   Pulse 96   Temp (!) 97.1 F (36.2 C) (Temporal)   Resp 18   SpO2 100%   Physical Exam  Constitutional: He appears distressed.  Unresponsive  HENT:  Head: Normocephalic and atraumatic.  Gag reflex present  Eyes:  Sluggish pupils bilaterally  Neck: Normal range of motion. Neck supple.  Cardiovascular: Normal rate.  Weak femoral pulses bilaterally  Pulmonary/Chest: Effort normal. He exhibits no tenderness.  Dialysis catheter right chest  Equal breath sounds with bagging  Abdominal: Soft. He exhibits mass. There is tenderness.  Pulsatile abdominal mass  Musculoskeletal: Normal range of motion. He exhibits no edema, tenderness or deformity.  Neurological:  Unresponsive, sedated     ED Treatments / Results  Labs (all labs ordered are listed, but only abnormal results are displayed) Labs Reviewed  CBC WITH DIFFERENTIAL/PLATELET - Abnormal; Notable for the following components:      Result Value   WBC 18.2 (*)    RBC 2.72 (*)    Hemoglobin 7.3 (*)    HCT 24.7 (*)    MCHC 29.6 (*)    RDW 17.4 (*)    Neutro Abs 15.4 (*)    Monocytes Absolute 1.6 (*)    Abs Immature  Granulocytes 0.19 (*)    All other components within normal limits  I-STAT ARTERIAL BLOOD GAS, ED - Abnormal; Notable for the following components:   pO2, Arterial 381.0 (*)    Bicarbonate 19.5 (*)    TCO2 21 (*)    Acid-base deficit 6.0 (*)    All other components within normal limits  I-STAT CG4 LACTIC ACID, ED - Abnormal; Notable for the following components:   Lactic Acid, Venous 5.10 (*)    All other components within normal limits  I-STAT CHEM 8, ED - Abnormal; Notable for the following components:   Sodium 133 (*)    Potassium 5.4 (*)    BUN 57 (*)    Creatinine, Ser 12.70 (*)    Glucose, Bld 168 (*)    Calcium, Ion 1.11 (*)    Hemoglobin 8.2 (*)    HCT 24.0 (*)    All other components within  normal limits  I-STAT TROPONIN, ED - Abnormal; Notable for the following components:   Troponin i, poc 0.23 (*)    All other components within normal limits  COMPREHENSIVE METABOLIC PANEL  PROTIME-INR  HIV ANTIBODY (ROUTINE TESTING W REFLEX)  CBC  BASIC METABOLIC PANEL  BLOOD GAS, ARTERIAL  MAGNESIUM  PHOSPHORUS  TYPE AND SCREEN  PREPARE FRESH FROZEN PLASMA  PREPARE RBC (CROSSMATCH)    EKG EKG Interpretation  Date/Time:  Friday 07/10/2018 02:01:09 EST Ventricular Rate:  120 PR Interval:    QRS Duration: 120 QT Interval:  391 QTC Calculation: 553 R Axis:   48 Text Interpretation:  Junctional tachycardia Repolarization abnormality, prob rate related Prolonged QT interval No previous ECGs available Confirmed by Glynn Octave [phone]) on 07/10/2018 3:04:42 AM   Radiology Dg Chest Portable 1 View  Result Date: 07-10-18 CLINICAL DATA:  Endotracheal tube adjustment. EXAM: PORTABLE CHEST 1 VIEW COMPARISON:  Radiograph earlier this day FINDINGS: Endotracheal tube tip has been adjusted and is now seen 8.6 cm from the carina just above the thoracic inlet. Enteric tube in place with tip and side-port below the diaphragm not included in the field of view. Right dialysis catheter tip at the atrial caval junction. Patient is post median sternotomy. Mild cardiomegaly with postsurgical change in the upper mediastinum. No pneumothorax. Dense retrocardiac opacity. Suspect small bilateral pleural effusions. No pulmonary edema. IMPRESSION: 1. Endotracheal tube tip 8.6 cm from the carina. Enteric tube in place with tip and side-port below the diaphragm not included in the field of view. 2. Otherwise unchanged exam. Retrocardiac opacity with small bilateral pleural effusions and postsurgical change. Electronically Signed   By: Narda Rutherford M.D.   On: 07/10/18 03:03   Dg Chest Portable 1 View  Result Date: 07/10/18 CLINICAL DATA:  Endotracheal tube and OG tube placement. EXAM:  PORTABLE CHEST 1 VIEW COMPARISON:  Subsequent exam. Chest CTA 06/07/2018. FINDINGS: Endotracheal tube tip is tentatively identified high in positioning above the thoracic inlet, this has been subsequently advanced. Enteric tube in place with tip below the diaphragm not included in the field of view. Right dialysis catheter tip at the atrial caval junction. Patient is post median sternotomy. Mild cardiomegaly with postsurgical change in the upper mediastinum. No pneumothorax. Dense retrocardiac opacity. Suspect small bilateral pleural effusions. No pulmonary edema. IMPRESSION: 1. Endotracheal tube tip tentatively identified above the thoracic inlet, this has been subsequently advanced. Enteric tube in place with tip below the diaphragm not included in the field of view. 2. Dense retrocardiac opacity, likely atelectasis. Suspect small pleural effusions. 3. Cardiomegaly  with postsurgical change in the mediastinum. Electronically Signed   By: Narda Rutherford M.D.   On: 2018-06-21 03:01    Procedures Procedures (including critical care time)  Medications Ordered in ED Medications  fentaNYL (SUBLIMAZE) 100 MCG/2ML injection (has no administration in time range)  midazolam (VERSED) 2 MG/2ML injection (has no administration in time range)  0.9 %  sodium chloride infusion (10 mL/hr Intravenous Not Given 2018/06/21 0251)  0.9 %  sodium chloride infusion (Manually program via Guardrails IV Fluids) ( Intravenous Not Given 06-21-18 0252)  EPINEPHrine (ADRENALIN) 4 mg in dextrose 5 % 250 mL (0.016 mg/mL) infusion (6 mcg/min Intravenous Rate/Dose Change 06/21/2018 0247)  fentaNYL (SUBLIMAZE) injection (100 mcg Intravenous Given Jun 21, 2018 0214)  midazolam (VERSED) 5 MG/5ML injection (2 mg Intravenous Given 06-21-2018 0214)  0.9 %  sodium chloride infusion (has no administration in time range)  calcium gluconate 1 g/ 50 mL sodium chloride IVPB (has no administration in time range)  0.9 %  sodium chloride infusion (  Intravenous New Bag/Given Jun 21, 2018 0207)  0.9 %  sodium chloride infusion ( Intravenous New Bag/Given Jun 21, 2018 0205)     Initial Impression / Assessment and Plan / ED Course  I have reviewed the triage vital signs and the nursing notes.  Pertinent labs & imaging results that were available during my care of the patient were reviewed by me and considered in my medical decision making (see chart for details).    Patient collapsed at home with apparent respiratory arrest.  Now hypotensive.  EKG shows T wave inversions laterally. Airway confirmed on arrival with direct visualization  Concern for rupture of his known AAA.  Patient fluid resuscitated and started on emergency release blood. Large bore vascular access obtained.  Bedside ultrasound shows free fluid in the abdomen.  Vascular surgery emergently consulted.  Spoke with Dr. Randie Heinz at bedside.  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   Dr. Randie Heinz states he could offer emergent damage control surgery though the patient would be better served at [facility] but he is unfortunately not stable for transfer at this time.  Potassium 5.4.  Hemoglobin 8.2.  Was 10 on November 22. PRBCs continue to infuse.  Patient's family including his wife and mother do not feel he would want to have this surgery which he may not survive anyway. They request comfort care and extubation.  No further blood transfusion or pressors.  Dr. Randie Heinz of vascular surgery aware as well as critical care Dr. Hanley Hays.   After discussion with family, critical care has elected to extubate the patient in the ED and pursue comfort care.  Admission for comfort care d/w Dr. Clyde Lundborg and Dr. Ophelia Charter.    EMERGENCY DEPARTMENT [place] FAST EXAM "Limited Ultrasound of the Abdomen and Pericardium" (FAST Exam).   INDICATIONS:Abnornal vitals Multiple views of the  abdomen and pericardium are obtained with a multi-frequency probe.  PERFORMED BY: Myself IMAGES ARCHIVED?: Yes LIMITATIONS:  Emergent procedure INTERPRETATION:  Abdominal free fluid present and No pericardial effusion   CRITICAL CARE Performed by: Glynn Octave Total critical care time: 90 minutes Critical care time was exclusive of separately billable procedures and treating other patients. Critical care was necessary to treat or prevent imminent or life-threatening deterioration. Critical care was time spent personally by me on the following activities: development of treatment plan with patient and/or surrogate as well as nursing, discussions with consultants, evaluation  of patient's response to treatment, examination of patient, obtaining history from patient or surrogate, ordering and performing treatments and interventions, ordering and review of laboratory studies, ordering and review of radiographic studies, pulse oximetry and re-evaluation of patient's condition.  Final Clinical Impressions(s) / ED Diagnoses   Final diagnoses:  Hemorrhagic shock (HCC)  Acute respiratory failure, unspecified whether with hypoxia or hypercapnia (HCC)  Ruptured abdominal aortic aneurysm (AAA) [facility](HCC)    ED Discharge Orders    None       Glynn Octaveancour, Santresa Levett, MD 06/10/2018 [phone]

## 2018-06-15 ENCOUNTER — Other Ambulatory Visit: Payer: Self-pay

## 2018-06-15 ENCOUNTER — Inpatient Hospital Stay (HOSPITAL_COMMUNITY): Payer: Medicare Other

## 2018-06-15 LAB — PREPARE FRESH FROZEN PLASMA: Unit division: 0

## 2018-06-15 LAB — BPAM FFP
Blood Product Expiration Date: 201912042359
Blood Product Expiration Date: 201912042359
Blood Product Expiration Date: 201912042359
Blood Product Expiration Date: 201912042359
ISSUE DATE / TIME: 201911292105
ISSUE DATE / TIME: 201911292105
ISSUE DATE / TIME: 201911292105
ISSUE DATE / TIME: 201911292105
Unit Type and Rh: 1700
Unit Type and Rh: 7300
Unit Type and Rh: 7300
Unit Type and Rh: 7300

## 2018-06-15 LAB — CBC WITH DIFFERENTIAL/PLATELET
Abs Immature Granulocytes: 1.11 10*3/uL — ABNORMAL HIGH (ref 0.00–0.07)
Abs Immature Granulocytes: 0.8 10*3/uL — ABNORMAL HIGH (ref 0.00–0.07)
Abs Immature Granulocytes: 0.4 10*3/uL — ABNORMAL HIGH (ref 0.00–0.07)
BASOS PCT: 1 %
Band Neutrophils: 4 %
Band Neutrophils: 9 %
Basophils Absolute: 0.1 10*3/uL (ref 0.0–0.1)
Basophils Absolute: 0.1 10*3/uL (ref 0.0–0.1)
Basophils Absolute: 0 10*3/uL (ref 0.0–0.1)
Basophils Absolute: 0.1 10*3/uL (ref 0.0–0.1)
Basophils Relative: 0 %
Basophils Relative: 0 %
Basophils Relative: 1 %
Eosinophils Absolute: 0 10*3/uL (ref 0.0–0.5)
Eosinophils Absolute: 0 10*3/uL (ref 0.0–0.5)
Eosinophils Absolute: 0 10*3/uL (ref 0.0–0.5)
Eosinophils Absolute: 0.1 10*3/uL (ref 0.0–0.5)
Eosinophils Relative: 0 %
Eosinophils Relative: 0 %
Eosinophils Relative: 0 %
Eosinophils Relative: 1 %
HCT: 29.9 % — ABNORMAL LOW (ref 39.0–52.0)
HCT: 26.8 % — ABNORMAL LOW (ref 39.0–52.0)
HCT: 23 % — ABNORMAL LOW (ref 39.0–52.0)
HCT: 26.6 % — ABNORMAL LOW (ref 39.0–52.0)
Hemoglobin: 10.1 g/dL — ABNORMAL LOW (ref 13.0–17.0)
Hemoglobin: 9.2 g/dL — ABNORMAL LOW (ref 13.0–17.0)
Hemoglobin: 7.7 g/dL — ABNORMAL LOW (ref 13.0–17.0)
Hemoglobin: 8.9 g/dL — ABNORMAL LOW (ref 13.0–17.0)
Immature Granulocytes: 7 %
Lymphocytes Relative: 7 %
Lymphocytes Relative: 11 %
Lymphocytes Relative: 11 %
Lymphocytes Relative: 10 %
Lymphs Abs: 1.5 10*3/uL (ref 0.7–4.0)
Lymphs Abs: 1.7 10*3/uL (ref 0.7–4.0)
Lymphs Abs: 1.7 10*3/uL (ref 0.7–4.0)
Lymphs Abs: 1.3 10*3/uL (ref 0.7–4.0)
MCH: 29.4 pg (ref 26.0–34.0)
MCH: 29.4 pg (ref 26.0–34.0)
MCH: 28.8 pg (ref 26.0–34.0)
MCH: 29.7 pg (ref 26.0–34.0)
MCHC: 33.8 g/dL (ref 30.0–36.0)
MCHC: 34.3 g/dL (ref 30.0–36.0)
MCHC: 33.5 g/dL (ref 30.0–36.0)
MCHC: 33.5 g/dL (ref 30.0–36.0)
MCV: 86.9 fL (ref 80.0–100.0)
MCV: 85.6 fL (ref 80.0–100.0)
MCV: 86.1 fL (ref 80.0–100.0)
MCV: 88.7 fL (ref 80.0–100.0)
MONO ABS: 1.3 10*3/uL — AB (ref 0.1–1.0)
Metamyelocytes Relative: 4 %
Metamyelocytes Relative: 2 %
Monocytes Absolute: 2 10*3/uL — ABNORMAL HIGH (ref 0.1–1.0)
Monocytes Absolute: 0.8 10*3/uL (ref 0.1–1.0)
Monocytes Absolute: 0.6 10*3/uL (ref 0.1–1.0)
Monocytes Relative: 9 %
Monocytes Relative: 8 %
Monocytes Relative: 5 %
Monocytes Relative: 5 %
Myelocytes: 1 %
Myelocytes: 1 %
NRBC: 11 /100{WBCs} — AB
Neutro Abs: 18.7 10*3/uL — ABNORMAL HIGH (ref 1.7–7.7)
Neutro Abs: 11.9 10*3/uL — ABNORMAL HIGH (ref 1.7–7.7)
Neutro Abs: 11.9 10*3/uL — ABNORMAL HIGH (ref 1.7–7.7)
Neutro Abs: 10.1 10*3/uL — ABNORMAL HIGH (ref 1.7–7.7)
Neutrophils Relative %: 84 %
Neutrophils Relative %: 73 %
Neutrophils Relative %: 75 %
Neutrophils Relative %: 71 %
Platelets: 108 10*3/uL — ABNORMAL LOW (ref 150–400)
Platelets: 96 10*3/uL — ABNORMAL LOW (ref 150–400)
Platelets: 87 10*3/uL — ABNORMAL LOW (ref 150–400)
Platelets: 64 10*3/uL — ABNORMAL LOW (ref 150–400)
RBC: 3.44 MIL/uL — AB (ref 4.22–5.81)
RBC: 3.13 MIL/uL — ABNORMAL LOW (ref 4.22–5.81)
RBC: 2.67 MIL/uL — ABNORMAL LOW (ref 4.22–5.81)
RBC: 3 MIL/uL — AB (ref 4.22–5.81)
RDW: 15 % (ref 11.5–15.5)
RDW: 15 % (ref 11.5–15.5)
RDW: 15.4 % (ref 11.5–15.5)
RDW: 14.9 % (ref 11.5–15.5)
WBC: 22.3 10*3/uL — ABNORMAL HIGH (ref 4.0–10.5)
WBC: 16.1 10*3/uL — ABNORMAL HIGH (ref 4.0–10.5)
WBC: 15.1 10*3/uL — ABNORMAL HIGH (ref 4.0–10.5)
WBC: 12.6 10*3/uL — ABNORMAL HIGH (ref 4.0–10.5)
nRBC: 0.7 % — ABNORMAL HIGH (ref 0.0–0.2)
nRBC: 2 % — ABNORMAL HIGH (ref 0.0–0.2)
nRBC: 4 /100{WBCs} — ABNORMAL HIGH
nRBC: 4.2 % — ABNORMAL HIGH (ref 0.0–0.2)
nRBC: 5.5 % — ABNORMAL HIGH (ref 0.0–0.2)

## 2018-06-15 LAB — POCT I-STAT 3, ART BLOOD GAS (G3+)
ACID-BASE DEFICIT: 15 mmol/L — AB (ref 0.0–2.0)
Acid-base deficit: 11 mmol/L — ABNORMAL HIGH (ref 0.0–2.0)
Acid-base deficit: 8 mmol/L — ABNORMAL HIGH (ref 0.0–2.0)
Acid-base deficit: 4 mmol/L — ABNORMAL HIGH (ref 0.0–2.0)
Acid-base deficit: 8 mmol/L — ABNORMAL HIGH (ref 0.0–2.0)
Bicarbonate: 14.1 mmol/L — ABNORMAL LOW (ref 20.0–28.0)
Bicarbonate: 15.7 mmol/L — ABNORMAL LOW (ref 20.0–28.0)
Bicarbonate: 10.7 mmol/L — ABNORMAL LOW (ref 20.0–28.0)
Bicarbonate: 19.6 mmol/L — ABNORMAL LOW (ref 20.0–28.0)
Bicarbonate: 16.1 mmol/L — ABNORMAL LOW (ref 20.0–28.0)
O2 Saturation: 100 %
O2 Saturation: 100 %
O2 Saturation: 100 %
O2 Saturation: 100 %
O2 Saturation: 98 %
PCO2 ART: 26.3 mmHg — AB (ref 32.0–48.0)
PCO2 ART: 22.4 mmHg — AB (ref 32.0–48.0)
PO2 ART: 186 mmHg — AB (ref 83.0–108.0)
Patient temperature: 94
Patient temperature: 98
Patient temperature: 96
Patient temperature: 36.5
TCO2: 15 mmol/L — ABNORMAL LOW (ref 22–32)
TCO2: 16 mmol/L — ABNORMAL LOW (ref 22–32)
TCO2: 11 mmol/L — ABNORMAL LOW (ref 22–32)
TCO2: 20 mmol/L — ABNORMAL LOW (ref 22–32)
TCO2: 17 mmol/L — ABNORMAL LOW (ref 22–32)
pCO2 arterial: 21.8 mmHg — ABNORMAL LOW (ref 32.0–48.0)
pCO2 arterial: 26.3 mmHg — ABNORMAL LOW (ref 32.0–48.0)
pCO2 arterial: 26.5 mmHg — ABNORMAL LOW (ref 32.0–48.0)
pH, Arterial: 7.335 — ABNORMAL LOW (ref 7.350–7.450)
pH, Arterial: 7.442 (ref 7.350–7.450)
pH, Arterial: 7.292 — ABNORMAL LOW (ref 7.350–7.450)
pH, Arterial: 7.478 — ABNORMAL HIGH (ref 7.350–7.450)
pH, Arterial: 7.388 (ref 7.350–7.450)
pO2, Arterial: 192 mmHg — ABNORMAL HIGH (ref 83.0–108.0)
pO2, Arterial: 201 mmHg — ABNORMAL HIGH (ref 83.0–108.0)
pO2, Arterial: 352 mmHg — ABNORMAL HIGH (ref 83.0–108.0)
pO2, Arterial: 97 mmHg (ref 83.0–108.0)

## 2018-06-15 LAB — POCT I-STAT, CHEM 8
BUN: 62 mg/dL — ABNORMAL HIGH (ref 6–20)
BUN: 58 mg/dL — ABNORMAL HIGH (ref 6–20)
Calcium, Ion: 0.99 mmol/L — ABNORMAL LOW (ref 1.15–1.40)
Calcium, Ion: 1.04 mmol/L — ABNORMAL LOW (ref 1.15–1.40)
Chloride: 110 mmol/L (ref 98–111)
Chloride: 109 mmol/L (ref 98–111)
Creatinine, Ser: 10.8 mg/dL — ABNORMAL HIGH (ref 0.61–1.24)
Creatinine, Ser: 9.4 mg/dL — ABNORMAL HIGH (ref 0.61–1.24)
Glucose, Bld: 115 mg/dL — ABNORMAL HIGH (ref 70–99)
Glucose, Bld: 104 mg/dL — ABNORMAL HIGH (ref 70–99)
HCT: 27 % — ABNORMAL LOW (ref 39.0–52.0)
HEMATOCRIT: 23 % — AB (ref 39.0–52.0)
Hemoglobin: 7.8 g/dL — ABNORMAL LOW (ref 13.0–17.0)
Hemoglobin: 9.2 g/dL — ABNORMAL LOW (ref 13.0–17.0)
POTASSIUM: 5.7 mmol/L — AB (ref 3.5–5.1)
Potassium: 6.2 mmol/L — ABNORMAL HIGH (ref 3.5–5.1)
Sodium: 141 mmol/L (ref 135–145)
Sodium: 141 mmol/L (ref 135–145)
TCO2: 18 mmol/L — ABNORMAL LOW (ref 22–32)
TCO2: 17 mmol/L — ABNORMAL LOW (ref 22–32)

## 2018-06-15 LAB — CBC
HCT: 30.5 % — ABNORMAL LOW (ref 39.0–52.0)
Hemoglobin: 10 g/dL — ABNORMAL LOW (ref 13.0–17.0)
MCH: 29.5 pg (ref 26.0–34.0)
MCHC: 32.8 g/dL (ref 30.0–36.0)
MCV: 90 fL (ref 80.0–100.0)
Platelets: 96 10*3/uL — ABNORMAL LOW (ref 150–400)
RBC: 3.39 MIL/uL — ABNORMAL LOW (ref 4.22–5.81)
RDW: 15 % (ref 11.5–15.5)
WBC: 18.2 10*3/uL — ABNORMAL HIGH (ref 4.0–10.5)
nRBC: 0.5 % — ABNORMAL HIGH (ref 0.0–0.2)

## 2018-06-15 LAB — GLUCOSE, CAPILLARY
GLUCOSE-CAPILLARY: 112 mg/dL — AB (ref 70–99)
Glucose-Capillary: 130 mg/dL — ABNORMAL HIGH (ref 70–99)
Glucose-Capillary: 21 mg/dL — CL (ref 70–99)
Glucose-Capillary: 26 mg/dL — CL (ref 70–99)
Glucose-Capillary: 76 mg/dL (ref 70–99)
Glucose-Capillary: 69 mg/dL — ABNORMAL LOW (ref 70–99)
Glucose-Capillary: 107 mg/dL — ABNORMAL HIGH (ref 70–99)
Glucose-Capillary: 79 mg/dL (ref 70–99)
Glucose-Capillary: 170 mg/dL — ABNORMAL HIGH (ref 70–99)

## 2018-06-15 LAB — COMPREHENSIVE METABOLIC PANEL
ALT: 709 U/L — ABNORMAL HIGH (ref 0–44)
AST: 864 U/L — ABNORMAL HIGH (ref 15–41)
Albumin: 2.4 g/dL — ABNORMAL LOW (ref 3.5–5.0)
Alkaline Phosphatase: 40 U/L (ref 38–126)
Anion gap: 23 — ABNORMAL HIGH (ref 5–15)
BUN: 50 mg/dL — AB (ref 6–20)
CO2: 12 mmol/L — ABNORMAL LOW (ref 22–32)
Calcium: 8.1 mg/dL — ABNORMAL LOW (ref 8.9–10.3)
Chloride: 108 mmol/L (ref 98–111)
Creatinine, Ser: 9.01 mg/dL — ABNORMAL HIGH (ref 0.61–1.24)
GFR calc Af Amer: 7 mL/min — ABNORMAL LOW (ref >60)
GFR calc non Af Amer: 6 mL/min — ABNORMAL LOW (ref >60)
Glucose, Bld: 101 mg/dL — ABNORMAL HIGH (ref 70–99)
Potassium: 5.4 mmol/L — ABNORMAL HIGH (ref 3.5–5.1)
SODIUM: 143 mmol/L (ref 135–145)
Total Bilirubin: 2.2 mg/dL — ABNORMAL HIGH (ref 0.3–1.2)
Total Protein: 4.2 g/dL — ABNORMAL LOW (ref 6.5–8.1)

## 2018-06-15 LAB — BLOOD GAS, ARTERIAL
Acid-base deficit: 7.4 mmol/L — ABNORMAL HIGH (ref 0.0–2.0)
BICARBONATE: 15.9 mmol/L — AB (ref 20.0–28.0)
FIO2: 0.5
O2 Saturation: 99.2 %
PEEP/CPAP: 5 cmH2O
Patient temperature: 96.4
RATE: 24 resp/min
VT: 600 mL
pCO2 arterial: 21.9 mmHg — ABNORMAL LOW (ref 32.0–48.0)
pH, Arterial: 7.467 — ABNORMAL HIGH (ref 7.350–7.450)
pO2, Arterial: 146 mmHg — ABNORMAL HIGH (ref 83.0–108.0)

## 2018-06-15 LAB — RENAL FUNCTION PANEL
ANION GAP: 21 — AB (ref 5–15)
Albumin: 2.3 g/dL — ABNORMAL LOW (ref 3.5–5.0)
Albumin: 2.6 g/dL — ABNORMAL LOW (ref 3.5–5.0)
Albumin: 2.5 g/dL — ABNORMAL LOW (ref 3.5–5.0)
Albumin: 2.4 g/dL — ABNORMAL LOW (ref 3.5–5.0)
Anion gap: 23 — ABNORMAL HIGH (ref 5–15)
Anion gap: 20 — ABNORMAL HIGH (ref 5–15)
Anion gap: 20 — ABNORMAL HIGH (ref 5–15)
BUN: 51 mg/dL — ABNORMAL HIGH (ref 6–20)
BUN: 36 mg/dL — ABNORMAL HIGH (ref 6–20)
BUN: 28 mg/dL — AB (ref 6–20)
BUN: 24 mg/dL — ABNORMAL HIGH (ref 6–20)
CHLORIDE: 105 mmol/L (ref 98–111)
CO2: 13 mmol/L — ABNORMAL LOW (ref 22–32)
CO2: 15 mmol/L — ABNORMAL LOW (ref 22–32)
CO2: 15 mmol/L — ABNORMAL LOW (ref 22–32)
CO2: 14 mmol/L — ABNORMAL LOW (ref 22–32)
Calcium: 8.2 mg/dL — ABNORMAL LOW (ref 8.9–10.3)
Calcium: 7.8 mg/dL — ABNORMAL LOW (ref 8.9–10.3)
Calcium: 7.6 mg/dL — ABNORMAL LOW (ref 8.9–10.3)
Calcium: 7.6 mg/dL — ABNORMAL LOW (ref 8.9–10.3)
Chloride: 107 mmol/L (ref 98–111)
Chloride: 103 mmol/L (ref 98–111)
Chloride: 103 mmol/L (ref 98–111)
Creatinine, Ser: 8.85 mg/dL — ABNORMAL HIGH (ref 0.61–1.24)
Creatinine, Ser: 6.52 mg/dL — ABNORMAL HIGH (ref 0.61–1.24)
Creatinine, Ser: 5.38 mg/dL — ABNORMAL HIGH (ref 0.61–1.24)
Creatinine, Ser: 4.73 mg/dL — ABNORMAL HIGH (ref 0.61–1.24)
GFR calc Af Amer: 7 mL/min — ABNORMAL LOW
GFR calc Af Amer: 11 mL/min — ABNORMAL LOW (ref >60)
GFR calc Af Amer: 13 mL/min — ABNORMAL LOW (ref >60)
GFR calc non Af Amer: 6 mL/min — ABNORMAL LOW
GFR calc non Af Amer: 9 mL/min — ABNORMAL LOW (ref >60)
GFR, EST AFRICAN AMERICAN: 16 mL/min — AB (ref >60)
GFR, EST NON AFRICAN AMERICAN: 12 mL/min — AB (ref >60)
GFR, EST NON AFRICAN AMERICAN: 13 mL/min — AB (ref >60)
Glucose, Bld: 104 mg/dL — ABNORMAL HIGH (ref 70–99)
Glucose, Bld: 39 mg/dL — CL (ref 70–99)
Glucose, Bld: 80 mg/dL (ref 70–99)
Glucose, Bld: 69 mg/dL — ABNORMAL LOW (ref 70–99)
POTASSIUM: 5.4 mmol/L — AB (ref 3.5–5.1)
Phosphorus: 8.3 mg/dL — ABNORMAL HIGH (ref 2.5–4.6)
Phosphorus: 6.3 mg/dL — ABNORMAL HIGH (ref 2.5–4.6)
Phosphorus: 6.2 mg/dL — ABNORMAL HIGH (ref 2.5–4.6)
Phosphorus: 6.5 mg/dL — ABNORMAL HIGH (ref 2.5–4.6)
Potassium: 5.4 mmol/L — ABNORMAL HIGH (ref 3.5–5.1)
Potassium: 5.6 mmol/L — ABNORMAL HIGH (ref 3.5–5.1)
Potassium: 6 mmol/L — ABNORMAL HIGH (ref 3.5–5.1)
Sodium: 143 mmol/L (ref 135–145)
Sodium: 141 mmol/L (ref 135–145)
Sodium: 138 mmol/L (ref 135–145)
Sodium: 137 mmol/L (ref 135–145)

## 2018-06-15 LAB — PROTIME-INR
INR: 2.88
Prothrombin Time: 29.7 seconds — ABNORMAL HIGH (ref 11.4–15.2)

## 2018-06-15 LAB — MAGNESIUM
MAGNESIUM: 2.2 mg/dL (ref 1.7–2.4)
Magnesium: 2.2 mg/dL (ref 1.7–2.4)

## 2018-06-15 LAB — AMYLASE: Amylase: 86 U/L (ref 28–100)

## 2018-06-15 LAB — POCT I-STAT 4, (NA,K, GLUC, HGB,HCT)
Glucose, Bld: 113 mg/dL — ABNORMAL HIGH (ref 70–99)
HCT: 24 % — ABNORMAL LOW (ref 39.0–52.0)
Hemoglobin: 8.2 g/dL — ABNORMAL LOW (ref 13.0–17.0)
Potassium: 5.2 mmol/L — ABNORMAL HIGH (ref 3.5–5.1)
Sodium: 146 mmol/L — ABNORMAL HIGH (ref 135–145)

## 2018-06-15 LAB — MRSA PCR SCREENING: MRSA by PCR: NEGATIVE

## 2018-06-15 LAB — APTT: aPTT: 42 seconds — ABNORMAL HIGH (ref 24–36)

## 2018-06-15 LAB — HEMOGLOBIN A1C
Hgb A1c MFr Bld: 5.4 % (ref 4.8–5.6)
Mean Plasma Glucose: 108.28 mg/dL

## 2018-06-15 LAB — TROPONIN I: Troponin I: 0.29 ng/mL (ref <0.03)

## 2018-06-15 LAB — PHOSPHORUS: Phosphorus: 8.2 mg/dL — ABNORMAL HIGH (ref 2.5–4.6)

## 2018-06-15 LAB — PREPARE RBC (CROSSMATCH)

## 2018-06-15 LAB — LACTIC ACID, PLASMA: LACTIC ACID, VENOUS: 11.5 mmol/L — AB (ref 0.5–1.9)

## 2018-06-15 MED ORDER — CHLORHEXIDINE GLUCONATE CLOTH 2 % EX PADS
6.0000 | MEDICATED_PAD | Freq: Every day | CUTANEOUS | Status: DC
  Administered 2018-06-15: 6 via TOPICAL

## 2018-06-15 MED ORDER — SODIUM CHLORIDE 0.9% FLUSH
10.0000 mL | Freq: Two times a day (BID) | INTRAVENOUS | Status: DC
  Administered 2018-06-15: 10 mL

## 2018-06-15 MED ORDER — SODIUM BICARBONATE 8.4 % IV SOLN
INTRAVENOUS | Status: AC
  Filled 2018-06-15: qty 50

## 2018-06-15 MED ORDER — DEXTROSE 50 % IV SOLN
INTRAVENOUS | Status: AC
  Administered 2018-06-15: 50 mL via INTRAVENOUS
  Filled 2018-06-15: qty 50

## 2018-06-15 MED ORDER — SODIUM BICARBONATE 8.4 % IV SOLN
200.0000 meq | Freq: Once | INTRAVENOUS | Status: AC
  Administered 2018-06-15: 200 meq via INTRAVENOUS

## 2018-06-15 MED ORDER — DEXTROSE 50 % IV SOLN
INTRAVENOUS | Status: AC
  Administered 2018-06-15: 25 mL via INTRAVENOUS
  Filled 2018-06-15: qty 50

## 2018-06-15 MED ORDER — FENTANYL CITRATE (PF) 100 MCG/2ML IJ SOLN
100.0000 ug | INTRAMUSCULAR | Status: DC | PRN
  Administered 2018-06-15 (×4): 100 ug via INTRAVENOUS
  Filled 2018-06-15 (×3): qty 2

## 2018-06-15 MED ORDER — VASOPRESSIN 20 UNIT/ML IV SOLN
0.0300 [IU]/min | INTRAVENOUS | Status: DC
  Administered 2018-06-15 (×2): 0.03 [IU]/min via INTRAVENOUS
  Filled 2018-06-15 (×2): qty 2

## 2018-06-15 MED ORDER — STERILE WATER FOR INJECTION IV SOLN
INTRAVENOUS | Status: DC
  Administered 2018-06-15 (×2): via INTRAVENOUS
  Filled 2018-06-15 (×3): qty 850

## 2018-06-15 MED ORDER — SODIUM BICARBONATE 8.4 % IV SOLN
INTRAVENOUS | Status: AC
  Administered 2018-06-15: 200 meq via INTRAVENOUS
  Filled 2018-06-15: qty 200

## 2018-06-15 MED ORDER — DOPAMINE-DEXTROSE 3.2-5 MG/ML-% IV SOLN
INTRAVENOUS | Status: AC
  Filled 2018-06-15: qty 250

## 2018-06-15 MED ORDER — SODIUM CHLORIDE 0.9% IV SOLUTION
Freq: Once | INTRAVENOUS | Status: AC
  Administered 2018-06-15: 14:00:00 via INTRAVENOUS

## 2018-06-15 MED ORDER — ORAL CARE MOUTH RINSE
15.0000 mL | OROMUCOSAL | Status: DC
  Administered 2018-06-15 – 2018-06-16 (×10): 15 mL via OROMUCOSAL

## 2018-06-15 MED ORDER — CHLORHEXIDINE GLUCONATE 0.12% ORAL RINSE (MEDLINE KIT)
15.0000 mL | Freq: Two times a day (BID) | OROMUCOSAL | Status: DC
  Administered 2018-06-15 (×2): 15 mL via OROMUCOSAL

## 2018-06-15 MED ORDER — DEXTROSE IN LACTATED RINGERS 5 % IV SOLN
INTRAVENOUS | Status: DC
  Administered 2018-06-15 (×3): via INTRAVENOUS

## 2018-06-15 MED ORDER — SODIUM CHLORIDE 0.9 % IV SOLN
0.0000 ug/min | INTRAVENOUS | Status: DC
  Administered 2018-06-15: 340 ug/min via INTRAVENOUS
  Administered 2018-06-15: 300 ug/min via INTRAVENOUS
  Administered 2018-06-15: 250 ug/min via INTRAVENOUS
  Administered 2018-06-15: 400 ug/min via INTRAVENOUS
  Administered 2018-06-16: 370 ug/min via INTRAVENOUS
  Administered 2018-06-16: 175 ug/min via INTRAVENOUS
  Filled 2018-06-15: qty 8
  Filled 2018-06-15 (×2): qty 5
  Filled 2018-06-15 (×4): qty 8

## 2018-06-15 MED ORDER — SODIUM CHLORIDE 0.9% IV SOLUTION
Freq: Once | INTRAVENOUS | Status: AC
  Administered 2018-06-15: 09:00:00 via INTRAVENOUS

## 2018-06-15 MED ORDER — SODIUM CHLORIDE 0.9% FLUSH: 10.0000 mL | INTRAVENOUS | Status: DC | PRN

## 2018-06-15 MED ORDER — NOREPINEPHRINE 16 MG/250ML-% IV SOLN
0.0000 ug/min | INTRAVENOUS | Status: DC
  Administered 2018-06-15: 60 ug/min via INTRAVENOUS
  Administered 2018-06-15: 35 ug/min via INTRAVENOUS
  Administered 2018-06-15: 40 ug/min via INTRAVENOUS
  Administered 2018-06-16: 50 ug/min via INTRAVENOUS
  Filled 2018-06-15 (×5): qty 250

## 2018-06-15 NOTE — Progress Notes (Signed)
  Progress Note    06/15/2018 1:07 PM 1 Day Post-Op  Subjective:  Remains intubated/sedated  increasing pressor requirement overnight  Vitals:   06/15/18 1245 06/15/18 1300  BP: (!) 114/92 (!) 110/55  Pulse:    Resp: 15 15  Temp: (!) 97.5 F (36.4 C) (!) 97.2 F (36.2 C)  SpO2:      Physical Exam: Sedated Abdominal wound vac in place, abdomen is soft Right groin dressing cdi Strong left pt signal, can only get popliteal signal on right but feet are warm No good neuro exam could be performed  CBC    Component Value Date/Time   WBC 15.1 (H) 06/15/2018 1112   RBC 2.67 (L) 06/15/2018 1112   HGB 7.7 (L) 06/15/2018 1112   HCT 23.0 (L) 06/15/2018 1112   PLT 87 (L) 06/15/2018 1112   MCV 86.1 06/15/2018 1112   MCH 28.8 06/15/2018 1112   MCHC 33.5 06/15/2018 1112   RDW 15.4 06/15/2018 1112   LYMPHSABS 1.7 06/15/2018 1112   MONOABS 0.8 06/15/2018 1112   EOSABS 0.0 06/15/2018 1112   BASOSABS 0.0 06/15/2018 1112    BMET    Component Value Date/Time   NA 141 06/15/2018 1105   K 5.4 (H) 06/15/2018 1105   CL 105 06/15/2018 1105   CO2 15 (L) 06/15/2018 1105   GLUCOSE 39 (LL) 06/15/2018 1105   BUN 36 (H) 06/15/2018 1105   CREATININE 6.52 (H) 06/15/2018 1105   CALCIUM 7.8 (L) 06/15/2018 1105   GFRNONAA 9 (L) 06/15/2018 1105   GFRAA 11 (L) 06/15/2018 1105    INR    Component Value Date/Time   INR 2.88 06/15/2018 0625     Intake/Output Summary (Last 24 hours) at 06/15/2018 1307 Last data filed at 06/15/2018 1305 Gross per 24 hour  Intake 13846.26 ml  Output 3034 ml  Net 10812.26 ml     Assessment:  47 y.o. male is s/p hybrid open and endovascular TAAA repair Plan: Nephrology and critical care appreciated Transfuse 4 ffp and 2 units prbc's Has been switched to d5 for hypoglycemia I would like to get a baseline neuro eval today if he is stable enough Tentative plan for return to OR tomorrow for abdominal washout and possible closure.   Danikah Budzik C. Randie Heinzain,  MD [facility] [place] Office: [phone] Pager: [phone]  06/15/2018 1:07 PM

## 2018-06-15 NOTE — Progress Notes (Signed)
ABG drawn and results given to CCM Dr Kearney Hardover. No changes at this time.

## 2018-06-15 NOTE — Progress Notes (Signed)
  Hypoglycemic Event  CBG: 26  Treatment: D50 IV 50 mL  Symptoms: None  Follow-up CBG: Time:1158 CBG Result:112  Possible Reasons for Event: Unknown  Comments/MD notified:CCM MD notified, to start patient on dextrose drip    Arley Salamone Y

## 2018-06-15 NOTE — Progress Notes (Signed)
Initial Nutrition Assessment  DOCUMENTATION CODES:   Not applicable  INTERVENTION:   If tube feeds initiated recommend Vital 1.5 @45ml /hr + Prostat 60ml BID  Free water flushes 30ml q 4 hours  Regimen provides 2020kcal/day, 133g/day protein, 108205ml/day free water  Recommend B-Complex with C daily via tube  Recommend vitamin C 500mg  BID via tube  Recommend Ocuvite daily via tube for wound healing (provides zinc, vitamin A, vitamin C, Vitamin E, copper, and selenium)  NUTRITION DIAGNOSIS:   Inadequate oral intake related to acute illness(pt sedated and ventillated ) as evidenced by NPO status.  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 No plans for tube feeds today; pt to possibly return to surgery tomorrow for closure. Wound VAC in place. OGT in place. On CRRT. There is no documented weight history in chart to determine if any weight loss. Tube feed recommendations above. Recommend vitamin supplementation for wound healing.   Medications reviewed and include: insulin, protonix, cefazolin, LRS w/ 5% dextrose @100ml /hr, dopamine, Mg sulfate, levophed, neo-synephrine, vasopressin, fentanyl  Labs reviewed: K 5.4(H), CO2 15(L), BUN 36(H), creat 6.52(H), P 6.3(H), Ca 7.8(L) adj. 8.92 wnl, alb 2.6(L) Wbc- 15.1(H), Hgb 7.7(L), Hct 23(L) cbgs- 21, 130, 26 x 24 hrs  Patient is currently intubated on ventilator support MV: 14.2 L/min Temp (24hrs), Avg:96.7 F (35.9 C), Min:93.2 F (34 C), Max:98.1 F (36.7 C)  Propofol: none  MAP- >3760mmHg  NUTRITION - FOCUSED PHYSICAL EXAM:   Most Recent Value  Orbital Region  No depletion  Upper Arm Region  No depletion  Thoracic and Lumbar Region  No depletion  Buccal Region  No depletion  Temple Region  No depletion  Clavicle Bone Region  No depletion  Clavicle and Acromion Bone Region  No depletion  Scapular Bone Region  No depletion  Dorsal Hand  No depletion  Patellar Region  No depletion  Anterior Thigh Region  No depletion  Posterior Calf Region  No depletion  Edema (RD Assessment)  Mild  Hair  Reviewed  Eyes  Reviewed  Mouth  Reviewed  Skin  Reviewed  Nails  Reviewed     Diet Order:   Diet Order            Diet NPO time specified  Diet effective now             EDUCATION NEEDS:   No education needs have been identified at this time  Skin:  Skin Assessment: Reviewed RN Assessment(ecchymosis, incision R groin, incision abdomen with VAC)  Last BM:  pta  Height:   Ht Readings from Last 1 Encounters:  05/24/2018 5\' 9"  (1.753 m)    Weight:   Wt Readings from Last 1 Encounters:  06/13/2018 81.1 kg    Ideal Body Weight:  72.7 kg  BMI:  Body mass index is 26.4 kg/m.  Estimated Nutritional Needs:   Kcal:  1962kcal/day   Protein:  114-130g/day   Fluid:  UOP + 1L  Betsey Holidayasey Rodel Glaspy MS, RD, LDN Pager #- [phone] Office#- [phone] After Hours Pager: [phone]

## 2018-06-15 NOTE — Progress Notes (Signed)
Hypoglycemic Event  CBG: 69   Treatment: D50 IV 25 mL  Symptoms: None  Follow-up CBG: Time:1549 CBG Result:107  Possible Reasons for Event: Unknown  Comments/MD notified:Hypoglycemic protocol initiated    Maycel Riffe Y

## 2018-06-15 NOTE — Procedures (Signed)
Admit: 06/15/2018 LOS: 1  46M ESRD s/p repair of ruptured thoracoabdominal aortic aneurysm 05/24/2018  Current CRRT Prescription: Start Date: 06/15/18 Catheter: R IJ Tunneled HD Cath BFR: 150 Pre Blood Pump: 1000 4K DFR: 1500 4K Replacement Rate: 500 4K Goal UF: no UF Anticoagulation: none Clotting: none since starting  S: Worsened overnight: Hypoxic, 100% FIO2 On PE, NE, VP gtt NaHCO3 gtt  Not tolerating any fluid removal Not tolerating target Qb   O: 11/29 0701 - 11/30 0700 In: 11400.3 [I.V.:6397.8; Blood:3521; IV Piggyback:1481.6] Out: 2536 [Drains:1000; Blood:1200]  Filed Weights   05/17/2018 1353 05/19/2018 2300  Weight: 68 kg 81.1 kg    Recent Labs  Lab 05/17/2018 0235  05/22/2018 2224 06/15/18 0242 06/15/18 0247  NA 136   < > 144 141 143  143  K 5.6*   < > 6.2* 5.7* 5.4*  5.4*  CL 98  --  111 109 108  107  CO2 20*  --  14*  --  12*  13*  GLUCOSE 174*   < > 116* 104* 101*  104*  BUN 62*  --  58* 58* 50*  51*  CREATININE 12.50*  --  10.34* 9.40* 9.01*  8.85*  CALCIUM 8.8*  --  7.8*  --  8.1*  8.2*  PHOS  --   --   --   --  8.2*  8.3*   < > = values in this interval not displayed.   Recent Labs  Lab 06/10/2018 0235  06/15/18 0018 06/15/18 0242 06/15/18 0247 06/15/18 0625  WBC 18.2*   < > 18.2*  --  22.3* 16.1*  NEUTROABS 15.4*  --   --   --  18.7* 11.9*  HGB 7.3*   < > 10.0* 9.2* 10.1* 9.2*  HCT 24.7*   < > 30.5* 27.0* 29.9* 26.8*  MCV 90.8   < > 90.0  --  86.9 85.6  PLT 381   < > 96*  --  108* 96*   < > = values in this interval not displayed.    Scheduled Meds: . sodium chloride   Intravenous Once  . sodium chloride   Intravenous Once  . chlorhexidine gluconate (MEDLINE KIT)  15 mL Mouth Rinse BID  . insulin aspart  0-9 Units Subcutaneous Q4H  . mouth rinse  15 mL Mouth Rinse 10 times per day  . pantoprazole (PROTONIX) IV  40 mg Intravenous QHS   Continuous Infusions: .  prismasol BGK 4/2.5 1,000 mL/hr at 06/15/18 0707  .  prismasol BGK  4/2.5 500 mL/hr at 06/15/18 0000  . sodium chloride    . sodium chloride    .  ceFAZolin (ANCEF) IV    . DOPamine    . magnesium sulfate 1 - 4 g bolus IVPB    . norepinephrine (LEVOPHED) Adult infusion 40 mcg/min (06/15/18 0800)  . phenylephrine (NEO-SYNEPHRINE) Adult infusion 400 mcg/min (06/15/18 0820)  . prismasol BGK 4/2.5 1,500 mL/hr at 06/15/18 0400  . propofol    .  sodium bicarbonate (isotonic) infusion in sterile water 125 mL/hr at 06/15/18 0800  . sodium chloride    . vasopressin (PITRESSIN) infusion - *FOR SHOCK* 0.04 Units/min (06/15/18 0800)   PRN Meds:.sodium chloride, sodium chloride, acetaminophen **OR** acetaminophen, antiseptic oral rinse, bisacodyl, fentaNYL (SUBLIMAZE) injection, guaiFENesin-dextromethorphan, heparin, hydrALAZINE, labetalol, magnesium sulfate 1 - 4 g bolus IVPB, metoprolol tartrate, midazolam, morphine injection, ondansetron, phenol, polyvinyl alcohol, sodium chloride  ABG    Component Value Date/Time   PHART 7.467 (H) 06/15/2018  0640   PCO2ART 21.9 (L) 06/15/2018 0640   PO2ART 146 (H) 06/15/2018 0640   HCO3 15.9 (L) 06/15/2018 0640   TCO2 16 (L) 06/15/2018 0244   ACIDBASEDEF 7.4 (H) 06/15/2018 0640   O2SAT 99.2 06/15/2018 0640    A/P  1. ESRD [facility], [facility] MWF 2. Ruptured thoracoabdominal aortic aneurysm s/p repair 11/29 3. Hyperkalemia; improving 4. Metabolic Acidosis, on CRRT + NaHCO3 gtt 5. Shock on PE/NE/VP 6. VDRF, severe hypoxia 7. CKDBMD, Phos ok  Cont CRRT at current settings. Labs q6h, Chagne to 0K Post Replacement if K > 5.5.  UF as able.     Pearson Grippe, MD [facility] pgr [phone]

## 2018-06-15 NOTE — Progress Notes (Signed)
eLink Physician-Brief Progress Note Patient Name: Atlee AbideKelsey Musgrave DOB: 07/02/1971 MRN: [id number]   Date of Service  06/15/2018  HPI/Events of Note  Metabolic acidosis with ongoing associated hypotension.  Current pH 7.27/23/208/10.7  eICU Interventions  Plan: 4 amps of bicarb IVP Bicarb gtt CRRT to be initated F/U on H/H     Intervention Category Major Interventions: Acid-Base disturbance - evaluation and management  DETERDING,ELIZABETH 06/15/2018, 12:22 AM

## 2018-06-15 NOTE — Progress Notes (Signed)
Hypoglycemic Event  CBG: 21  Treatment: D50 IV 50 mL  Symptoms: None  Follow-up CBG: Time:0811 CBG Result:130  Possible Reasons for Event: Unknown  Comments/MD notified:Hypoglycemic protocol inititated    Ernest Orr Y

## 2018-06-15 NOTE — Progress Notes (Signed)
PT Cancellation Note  Patient Details Name: Billy AbideKelsey Contreras MRN: [id number] DOB: 11/20/1970   Cancelled Treatment:    Reason Eval/Treat Not Completed: Patient not medically ready   Fabio AsaDevon J Princella Jaskiewicz 06/15/2018, 7:05 AM

## 2018-06-15 NOTE — Progress Notes (Addendum)
06/15/2018 2:53 AM Called to the bedside for worsening metabolic acidosis refractory hypertension. He is on maximum pressor support.  He is on CRRT and a bicarbonate drip with the continued worsening metabolic acidosis. Discussed the case with his wife and family members and they agree with no CPR and no shock at this time.  I have explained he may need to clear himself in the next few hours and and CPR and cardioversion would only calls unnecessary pain without any benefit. Dr.  Darrick Penna aware of change in CODE STATUS. Note Dr. Pascal Lux vascular surgeon is also aware of worsening condition.  With no further surgical interventions available. Exam: General:  Ill appearing male HEENT: et->vent Neuro: non esponsive CV: ST 137 PULM: even/non-labored, lungs bilaterally rhonchi Gopen wound with vac Extremities: cool unable to doppler pulses Skin: no rashes or lesions  BP 120/71   Pulse (!) 129   Temp (!) 97.1 F (36.2 C) (Temporal)   Resp (!) 24   Ht 5\' 9"  (1.753 m)   Wt 68 kg   SpO2 100%   BMI 22.15 kg/m  Recent Labs  Lab 06/02/2018 0235 05/30/2018 1541  05/22/2018 2108 06/13/2018 2224 06/15/18 0242  NA 136 139   < > 143 144 141  K 5.6* 5.5*   < > 6.0* 6.2* 5.7*  CL 98  --   --   --  111 109  CO2 20*  --   --   --  14*  --   BUN 62*  --   --   --  58* 58*  CREATININE 12.50*  --   --   --  10.34* 9.40*  GLUCOSE 174* 95  --   --  116* 104*   < > = values in this interval not displayed.   Recent Labs  Lab 06/03/2018 0235  06/03/2018 1937  05/19/2018 2224 06/15/18 0018 06/15/18 0242  HGB 7.3*   < >  --    < > 8.8* 10.0* 9.2*  HCT 24.7*   < >  --    < > 27.7* 30.5* 27.0*  WBC 18.2*  --   --   --  14.1* 18.2*  --   PLT 381  --  81*  --  80* 96*  --    < > = values in this interval not displayed.   Dg Chest Port 1 View  Result Date: 06/15/2018 CLINICAL DATA:  Left femoral central line insertion. EXAM: PORTABLE CHEST 1 VIEW COMPARISON:  Radiograph yesterday. FINDINGS: The inferior  approach central line tip is not visualized in the chest or upper abdomen. Endotracheal tube tip at the thoracic inlet. Enteric tube tip below the diaphragm, side-port in the region of the gastroesophageal junction. Unchanged right dialysis catheter with tip at the atrial caval junction. Post median sternotomy with thoracoabdominal aortic stent graft. Left pleural effusion and basilar opacity are unchanged. Cardiomegaly is unchanged. No pneumothorax or new abnormality. IMPRESSION: 1. The inferior approach central line is not visualized in the chest or included upper abdomen. 2. Otherwise unchanged exam. Support apparatus are otherwise unchanged. Left pleural effusion and basilar opacity again seen. Electronically Signed   By: Narda Rutherford M.D.   On: 06/15/2018 01:44   Dg Chest Port 1 View  Result Date: 05/27/2018 CLINICAL DATA:  Endotracheally intubated. Respiratory failure. Post recent repair of ruptured thoracoabdominal aneurysm. EXAM: PORTABLE CHEST 1 VIEW COMPARISON:  Radiograph earlier this day. FINDINGS: Endotracheal tube tip 4.9 cm from the carina. Enteric tube in place with  tip below the diaphragm in the stomach, side-port in the region of the gastroesophageal junction. Right dialysis catheter at the atrial caval junction. New thoracoabdominal stent graft, partially included. Patient is post median sternotomy. Unchanged cardiomegaly from prior exam. Left pleural effusion with hazy opacities throughout the hemithorax, left basilar opacity and circumferential pleural fluid. Right lung grossly clear. No visualized pneumothorax. Ribbon shaped density in the right upper quadrant of the abdomen is presumed postsurgical sponge, per operative note. IMPRESSION: 1. Endotracheal tube 4.9 cm from the carina. Enteric tube in place with tip below the diaphragm in the stomach, side-port in the region of the gastroesophageal junction. Advancement of 3 cm recommended for optimal placement. 2. Progressive left  pleural effusion. Unchanged left basilar opacity, likely compressive atelectasis. 3. Cardiomegaly is unchanged. Electronically Signed   By: Narda RutherfordMelanie  Sanford M.D.   On: 07-25-17 22:42   Dg Chest Portable 1 View  Result Date: 07-25-17 CLINICAL DATA:  Endotracheal tube adjustment. EXAM: PORTABLE CHEST 1 VIEW COMPARISON:  Radiograph earlier this day FINDINGS: Endotracheal tube tip has been adjusted and is now seen 8.6 cm from the carina just above the thoracic inlet. Enteric tube in place with tip and side-port below the diaphragm not included in the field of view. Right dialysis catheter tip at the atrial caval junction. Patient is post median sternotomy. Mild cardiomegaly with postsurgical change in the upper mediastinum. No pneumothorax. Dense retrocardiac opacity. Suspect small bilateral pleural effusions. No pulmonary edema. IMPRESSION: 1. Endotracheal tube tip 8.6 cm from the carina. Enteric tube in place with tip and side-port below the diaphragm not included in the field of view. 2. Otherwise unchanged exam. Retrocardiac opacity with small bilateral pleural effusions and postsurgical change. Electronically Signed   By: Narda RutherfordMelanie  Sanford M.D.   On: 07-25-17 03:03   Dg Chest Portable 1 View  Result Date: 07-25-17 CLINICAL DATA:  Endotracheal tube and OG tube placement. EXAM: PORTABLE CHEST 1 VIEW COMPARISON:  Subsequent exam. Chest CTA 06/07/2018. FINDINGS: Endotracheal tube tip is tentatively identified high in positioning above the thoracic inlet, this has been subsequently advanced. Enteric tube in place with tip below the diaphragm not included in the field of view. Right dialysis catheter tip at the atrial caval junction. Patient is post median sternotomy. Mild cardiomegaly with postsurgical change in the upper mediastinum. No pneumothorax. Dense retrocardiac opacity. Suspect small bilateral pleural effusions. No pulmonary edema. IMPRESSION: 1. Endotracheal tube tip tentatively identified  above the thoracic inlet, this has been subsequently advanced. Enteric tube in place with tip below the diaphragm not included in the field of view. 2. Dense retrocardiac opacity, likely atelectasis. Suspect small pleural effusions. 3. Cardiomegaly with postsurgical change in the mediastinum. Electronically Signed   By: Narda RutherfordMelanie  Sanford M.D.   On: 07-25-17 03:01   Dg Abd Portable 1v  Result Date: 07-25-17 CLINICAL DATA:  S/P AAA stent plmt EXAM: PORTABLE ABDOMEN - 1 VIEW COMPARISON:  CT, 06/07/2018 FINDINGS: A stent extends through the thoracoabdominal aorta. There are 4 ribbon like radiopaque foreign bodies, 1 in the upper abdomen, 2 in the left central abdomen and 1 in the right lower abdomen, consistent with surgical sponges. No evidence of a surgical instrument or needle. Distal aspect of the nasogastric tube projects in the expected location of the proximal stomach. Normal bowel gas pattern. IMPRESSION: 1. Four surgical sponges project within the abdomen as detailed above. 2. New aortic stent extends from the visualized lower thoracic aorta through the distal aorta. Electronically Signed   By: Onalee Huaavid  Ormond M.D.   On: 07-03-18 23:33   Refractory acidosis and refractory hypertension status post repeat AAA repair.  He is been made no shock no CPR with poor prognosis shared with family.  Brett Canales Minor ACNP [facility] P[facility] Pager [phone] till 1 pm If no answer page [phone] 06/15/2018, 2:57 AM

## 2018-06-16 ENCOUNTER — Inpatient Hospital Stay (INDEPENDENT_AMBULATORY_CARE_PROVIDER_SITE_OTHER): Payer: Medicare Other

## 2018-06-16 ENCOUNTER — Encounter (HOSPITAL_COMMUNITY): Payer: Self-pay | Admitting: Registered Nurse

## 2018-06-16 ENCOUNTER — Encounter (HOSPITAL_COMMUNITY): Admission: EM | Disposition: E | Payer: Self-pay | Source: Home / Self Care | Attending: Vascular Surgery

## 2018-06-16 DIAGNOSIS — I370 Nonrheumatic pulmonary valve stenosis: Secondary | ICD-10-CM

## 2018-06-16 DIAGNOSIS — I340 Nonrheumatic mitral (valve) insufficiency: Secondary | ICD-10-CM | POA: Diagnosis not present

## 2018-06-16 LAB — COMPREHENSIVE METABOLIC PANEL
ALT: 3055 U/L — ABNORMAL HIGH (ref 0–44)
AST: 8390 U/L — ABNORMAL HIGH (ref 15–41)
Albumin: 2.3 g/dL — ABNORMAL LOW (ref 3.5–5.0)
Alkaline Phosphatase: 168 U/L — ABNORMAL HIGH (ref 38–126)
Anion gap: 21 — ABNORMAL HIGH (ref 5–15)
BUN: 21 mg/dL — ABNORMAL HIGH (ref 6–20)
CO2: 11 mmol/L — ABNORMAL LOW (ref 22–32)
Calcium: 7.4 mg/dL — ABNORMAL LOW (ref 8.9–10.3)
Chloride: 105 mmol/L (ref 98–111)
Creatinine, Ser: 4.32 mg/dL — ABNORMAL HIGH (ref 0.61–1.24)
GFR calc Af Amer: 17 mL/min — ABNORMAL LOW (ref >60)
GFR calc non Af Amer: 15 mL/min — ABNORMAL LOW (ref >60)
Glucose, Bld: 45 mg/dL — ABNORMAL LOW (ref 70–99)
Potassium: 6.2 mmol/L — ABNORMAL HIGH (ref 3.5–5.1)
SODIUM: 137 mmol/L (ref 135–145)
Total Bilirubin: 5.3 mg/dL — ABNORMAL HIGH (ref 0.3–1.2)
Total Protein: 4.1 g/dL — ABNORMAL LOW (ref 6.5–8.1)

## 2018-06-16 LAB — CBC
HCT: 28.7 % — ABNORMAL LOW (ref 39.0–52.0)
HCT: 19.4 % — ABNORMAL LOW (ref 39.0–52.0)
Hemoglobin: 8.9 g/dL — ABNORMAL LOW (ref 13.0–17.0)
Hemoglobin: 6.3 g/dL — CL (ref 13.0–17.0)
MCH: 28.3 pg (ref 26.0–34.0)
MCH: 30 pg (ref 26.0–34.0)
MCHC: 31 g/dL (ref 30.0–36.0)
MCHC: 32.5 g/dL (ref 30.0–36.0)
MCV: 91.1 fL (ref 80.0–100.0)
MCV: 92.4 fL (ref 80.0–100.0)
PLATELETS: 36 10*3/uL — AB (ref 150–400)
Platelets: 25 10*3/uL — CL (ref 150–400)
RBC: 3.15 MIL/uL — ABNORMAL LOW (ref 4.22–5.81)
RBC: 2.1 MIL/uL — ABNORMAL LOW (ref 4.22–5.81)
RDW: 15.8 % — ABNORMAL HIGH (ref 11.5–15.5)
RDW: 15.9 % — ABNORMAL HIGH (ref 11.5–15.5)
WBC: 15.1 10*3/uL — ABNORMAL HIGH (ref 4.0–10.5)
WBC: 10.2 10*3/uL (ref 4.0–10.5)
nRBC: 9.7 % — ABNORMAL HIGH (ref 0.0–0.2)
nRBC: 18 % — ABNORMAL HIGH (ref 0.0–0.2)

## 2018-06-16 LAB — BPAM FFP
Blood Product Expiration Date: 201912052359
Blood Product Expiration Date: 201912052359
Blood Product Expiration Date: 201912052359
Blood Product Expiration Date: 201912052359
ISSUE DATE / TIME: 201911300833
ISSUE DATE / TIME: 201911301113
ISSUE DATE / TIME: 201911301321
ISSUE DATE / TIME: 201911301524
Unit Type and Rh: 7300
Unit Type and Rh: 7300
Unit Type and Rh: 7300
Unit Type and Rh: 7300

## 2018-06-16 LAB — POCT I-STAT, CHEM 8
BUN: 23 mg/dL — ABNORMAL HIGH (ref 6–20)
CALCIUM ION: 0.92 mmol/L — AB (ref 1.15–1.40)
CHLORIDE: 107 mmol/L (ref 98–111)
Creatinine, Ser: 4.5 mg/dL — ABNORMAL HIGH (ref 0.61–1.24)
Glucose, Bld: 36 mg/dL — CL (ref 70–99)
HCT: 18 % — ABNORMAL LOW (ref 39.0–52.0)
Hemoglobin: 6.1 g/dL — CL (ref 13.0–17.0)
Potassium: 6.8 mmol/L (ref 3.5–5.1)
Sodium: 137 mmol/L (ref 135–145)
TCO2: 13 mmol/L — ABNORMAL LOW (ref 22–32)

## 2018-06-16 LAB — PREPARE FRESH FROZEN PLASMA
UNIT DIVISION: 0
Unit division: 0
Unit division: 0

## 2018-06-16 LAB — POCT I-STAT 3, ART BLOOD GAS (G3+)
Acid-base deficit: 16 mmol/L — ABNORMAL HIGH (ref 0.0–2.0)
Acid-base deficit: 13 mmol/L — ABNORMAL HIGH (ref 0.0–2.0)
Bicarbonate: 10.4 mmol/L — ABNORMAL LOW (ref 20.0–28.0)
Bicarbonate: 12.4 mmol/L — ABNORMAL LOW (ref 20.0–28.0)
O2 Saturation: 90 %
O2 Saturation: 89 %
Patient temperature: 36.9
Patient temperature: 36.8
TCO2: 11 mmol/L — ABNORMAL LOW (ref 22–32)
TCO2: 13 mmol/L — ABNORMAL LOW (ref 22–32)
pCO2 arterial: 25.4 mmHg — ABNORMAL LOW (ref 32.0–48.0)
pCO2 arterial: 26.8 mmHg — ABNORMAL LOW (ref 32.0–48.0)
pH, Arterial: 7.221 — ABNORMAL LOW (ref 7.350–7.450)
pH, Arterial: 7.272 — ABNORMAL LOW (ref 7.350–7.450)
pO2, Arterial: 67 mmHg — ABNORMAL LOW (ref 83.0–108.0)
pO2, Arterial: 62 mmHg — ABNORMAL LOW (ref 83.0–108.0)

## 2018-06-16 LAB — PREPARE PLATELET PHERESIS: Unit division: 0

## 2018-06-16 LAB — ECHOCARDIOGRAM COMPLETE
Height: 69 in
Weight: 2860.69 oz

## 2018-06-16 LAB — GLUCOSE, CAPILLARY
GLUCOSE-CAPILLARY: 81 mg/dL (ref 70–99)
Glucose-Capillary: 39 mg/dL — CL (ref 70–99)
Glucose-Capillary: 39 mg/dL — CL (ref 70–99)
Glucose-Capillary: 85 mg/dL (ref 70–99)

## 2018-06-16 LAB — FIBRINOGEN: Fibrinogen: 87 mg/dL — CL (ref 210–475)

## 2018-06-16 LAB — PROTIME-INR
INR: >10
PROTHROMBIN TIME: 82.5 s — AB (ref 11.4–15.2)

## 2018-06-16 LAB — BPAM PLATELET PHERESIS
Blood Product Expiration Date: 201912022359
ISSUE DATE / TIME: 201912010636
Unit Type and Rh: 7300

## 2018-06-16 LAB — MAGNESIUM: Magnesium: 2.4 mg/dL (ref 1.7–2.4)

## 2018-06-16 LAB — APTT: aPTT: 29 seconds (ref 24–36)

## 2018-06-16 LAB — LACTIC ACID, PLASMA: Lactic Acid, Venous: 17.7 mmol/L (ref 0.5–1.9)

## 2018-06-16 SURGERY — REPAIR, DEHISCENCE, WOUND, ABDOMEN
Anesthesia: General

## 2018-06-16 MED ORDER — SODIUM BICARBONATE 8.4 % IV SOLN
50.0000 meq | Freq: Once | INTRAVENOUS | Status: AC
  Administered 2018-06-16: 50 meq via INTRAVENOUS

## 2018-06-16 MED ORDER — DEXTROSE 50 % IV SOLN
1.0000 | Freq: Once | INTRAVENOUS | Status: AC
  Administered 2018-06-16: 25 mL via INTRAVENOUS
  Filled 2018-06-16: qty 50

## 2018-06-16 MED ORDER — SODIUM BICARBONATE 8.4 % IV SOLN
INTRAVENOUS | Status: AC
  Administered 2018-06-16: 50 meq
  Filled 2018-06-16: qty 100

## 2018-06-16 MED ORDER — CALCIUM GLUCONATE-NACL 2-0.675 GM/100ML-% IV SOLN
2.0000 g | Freq: Once | INTRAVENOUS | Status: AC
  Administered 2018-06-16: 2000 mg via INTRAVENOUS
  Filled 2018-06-16: qty 100

## 2018-06-16 MED ORDER — SODIUM CHLORIDE 0.9% IV SOLUTION: Freq: Once | INTRAVENOUS | Status: DC

## 2018-06-16 MED ORDER — SODIUM BICARBONATE 8.4 % IV SOLN
100.0000 meq | Freq: Once | INTRAVENOUS | Status: AC
  Administered 2018-06-16: 100 meq via INTRAVENOUS

## 2018-06-16 MED ORDER — SODIUM BICARBONATE 8.4 % IV SOLN
INTRAVENOUS | Status: DC
  Administered 2018-06-16: 06:00:00 via INTRAVENOUS
  Filled 2018-06-16 (×2): qty 150

## 2018-06-16 MED ORDER — ALBUMIN HUMAN 5 % IV SOLN: 25.0000 g | Freq: Once | INTRAVENOUS | Status: DC

## 2018-06-16 MED ORDER — EPINEPHRINE PF 1 MG/10ML IJ SOSY
PREFILLED_SYRINGE | INTRAMUSCULAR | Status: AC
  Filled 2018-06-16: qty 10

## 2018-06-16 MED ORDER — ALBUMIN HUMAN 5 % IV SOLN
INTRAVENOUS | Status: AC
  Administered 2018-06-16: 12.5 g
  Filled 2018-06-16: qty 250

## 2018-06-16 MED ORDER — SODIUM CHLORIDE 0.9% IV SOLUTION
Freq: Once | INTRAVENOUS | Status: AC
  Administered 2018-06-16: 07:00:00 via INTRAVENOUS

## 2018-06-16 MED ORDER — SODIUM POLYSTYRENE SULFONATE 15 GM/60ML PO SUSP
45.0000 g | Freq: Once | ORAL | Status: AC
  Administered 2018-06-16: 45 g
  Filled 2018-06-16: qty 180

## 2018-06-16 MED ORDER — ALBUMIN HUMAN 5 % IV SOLN
INTRAVENOUS | Status: AC
  Administered 2018-06-16: 12.5 g
  Filled 2018-06-16: qty 500

## 2018-06-16 MED ORDER — ALBUMIN HUMAN 5 % IV SOLN
25.0000 g | Freq: Once | INTRAVENOUS | Status: AC
  Administered 2018-06-16: 25 g via INTRAVENOUS
  Filled 2018-06-16: qty 500

## 2018-06-16 DEATH — deceased

## 2018-06-17 ENCOUNTER — Encounter (HOSPITAL_COMMUNITY): Payer: Self-pay | Admitting: Surgery

## 2018-06-17 ENCOUNTER — Encounter (HOSPITAL_COMMUNITY): Payer: Self-pay

## 2018-06-17 LAB — TYPE AND SCREEN
ABO/RH(D): B POS
ANTIBODY SCREEN: NEGATIVE
UNIT DIVISION: 0
UNIT DIVISION: 0
Unit division: 0
Unit division: 0
Unit division: 0
Unit division: 0
Unit division: 0
Unit division: 0
Unit division: 0
Unit division: 0
Unit division: 0
Unit division: 0
Unit division: 0
Unit division: 0
Unit division: 0
Unit division: 0
Unit division: 0
Unit division: 0
Unit division: 0
Unit division: 0
Unit division: 0
Unit division: 0
Unit division: 0

## 2018-06-17 LAB — BPAM RBC
Blood Product Expiration Date: 201912042359
Blood Product Expiration Date: 201912042359
Blood Product Expiration Date: 201912042359
Blood Product Expiration Date: 201912042359
Blood Product Expiration Date: 201912052359
Blood Product Expiration Date: 201912052359
Blood Product Expiration Date: 201912052359
Blood Product Expiration Date: 201912052359
Blood Product Expiration Date: 201912062359
Blood Product Expiration Date: 201912062359
Blood Product Expiration Date: 201912062359
Blood Product Expiration Date: 201912062359
Blood Product Expiration Date: 201912072359
Blood Product Expiration Date: 201912082359
Blood Product Expiration Date: 201912092359
Blood Product Expiration Date: 201912102359
Blood Product Expiration Date: 201912172359
Blood Product Expiration Date: 201912202359
Blood Product Expiration Date: 201912232359
Blood Product Expiration Date: 201912242359
Blood Product Expiration Date: 201912252359
Blood Product Expiration Date: 201912252359
Blood Product Expiration Date: 201912252359
ISSUE DATE / TIME: 201911290209
ISSUE DATE / TIME: 201911290209
ISSUE DATE / TIME: 201911290246
ISSUE DATE / TIME: 201911291436
ISSUE DATE / TIME: 201911291436
ISSUE DATE / TIME: 201911291436
ISSUE DATE / TIME: 201911291436
ISSUE DATE / TIME: 201911291527
ISSUE DATE / TIME: 201911291527
ISSUE DATE / TIME: 201911291527
ISSUE DATE / TIME: 201911291527
ISSUE DATE / TIME: 201911291940
ISSUE DATE / TIME: 201911291940
ISSUE DATE / TIME: 201911301143
ISSUE DATE / TIME: 201911301216
ISSUE DATE / TIME: 201911301421
ISSUE DATE / TIME: 201911301619
ISSUE DATE / TIME: 201911301901
ISSUE DATE / TIME: 201912011440
ISSUE DATE / TIME: 201912011440
ISSUE DATE / TIME: 201912012104
Unit Type and Rh: 1700
Unit Type and Rh: 7300
Unit Type and Rh: 7300
Unit Type and Rh: 7300
Unit Type and Rh: 7300
Unit Type and Rh: 7300
Unit Type and Rh: 7300
Unit Type and Rh: 7300
Unit Type and Rh: 7300
Unit Type and Rh: 7300
Unit Type and Rh: 7300
Unit Type and Rh: 7300
Unit Type and Rh: 7300
Unit Type and Rh: 7300
Unit Type and Rh: 7300
Unit Type and Rh: 7300
Unit Type and Rh: 7300
Unit Type and Rh: 9500
Unit Type and Rh: 9500
Unit Type and Rh: 9500
Unit Type and Rh: 9500
Unit Type and Rh: 9500
Unit Type and Rh: 9500

## 2018-06-17 LAB — POCT I-STAT 4, (NA,K, GLUC, HGB,HCT)
Glucose, Bld: 156 mg/dL — ABNORMAL HIGH (ref 70–99)
HCT: 23 % — ABNORMAL LOW (ref 39.0–52.0)
Hemoglobin: 7.8 g/dL — ABNORMAL LOW (ref 13.0–17.0)
Potassium: 6.6 mmol/L (ref 3.5–5.1)
Sodium: 140 mmol/L (ref 135–145)

## 2018-06-17 LAB — CALCIUM, IONIZED: Calcium, Ionized, Serum: 3.6 mg/dL — ABNORMAL LOW (ref 4.5–5.6)

## 2018-06-17 MED FILL — Insulin Regular (Human) Inj 100 Unit/ML: INTRAMUSCULAR | Qty: 0.2 | Status: AC

## 2018-06-18 MED FILL — Heparin Sodium (Porcine) Inj 1000 Unit/ML: INTRAMUSCULAR | Qty: 30 | Status: AC

## 2018-06-18 MED FILL — Sodium Chloride IV Soln 0.9%: INTRAVENOUS | Qty: 3000 | Status: AC

## 2018-06-20 MED FILL — Sodium Bicarbonate IV Soln 8.4%: INTRAVENOUS | Qty: 50 | Status: AC

## 2018-06-20 MED FILL — Sodium Bicarbonate IV Soln 8.4%: INTRAVENOUS | Qty: 100 | Status: AC

## 2018-07-17 NOTE — Progress Notes (Signed)
  Echocardiogram 2D Echocardiogram has been performed.  [name] [name] 06/22/2018, 6:59 AM

## 2018-07-17 NOTE — Progress Notes (Signed)
   07/06/2018 0800  Clinical Encounter Type  Visited With Family  Visit Type Death  Spiritual Encounters  Spiritual Needs Grief support  Responded to follow-up death notice. Wife was at bedside grieving. Her husband passed at 7:00 am. Other relatives present. Gave sister-in-law patient placement card. Sister states that nurse have the wife contact information. Provided spiritual and grief support. Family just wanted more time to stay at bedside. Left family to grieve privately. Will follow-up as requested.

## 2018-07-17 NOTE — Anesthesia Postprocedure Evaluation (Signed)
Anesthesia Post Note  Patient: [name]  Procedure(s) Performed: THORACIC AORTIC ENDOVASCULAR STENT GRAFT (N/A ) INTRAVASCULAR ULTRASOUND/IVUS (N/A ) MESENTERIC ARTERY BYPASS with propaten vascular graft THROMBECTOMY FEMORAL ARTERY (Right )     Patient location during evaluation: SICU Anesthesia Type: General Level of consciousness: sedated Pain management: pain level controlled Vital Signs Assessment: post-procedure vital signs reviewed and stable Respiratory status: patient remains intubated per anesthesia plan Cardiovascular status: stable Postop Assessment: no apparent nausea or vomiting Anesthetic complications: no    Last Vitals:  Vitals:   06/17/2018 0342 06/26/2018 0400  BP:  (!) 105/53  Pulse: (!) 117   Resp: 20 20  Temp:  37 C  SpO2: 100%     Last Pain:  Vitals:   06/15/18 1600  TempSrc: Esophageal  PainSc:                  Jamyiah Labella S

## 2018-07-17 NOTE — Progress Notes (Addendum)
OT Cancellation Note  Patient Details Name: Billy AbideKelsey Contreras MRN: [id number] DOB: 01/30/1971   Cancelled Treatment:    Reason Eval/Treat Not Completed: Patient not medically ready.  Pt on CRRT, pressors, intubated and sedated.  Will check back for appropriateness.   Jeani HawkingWendi Reinhart Saulters, OTR/L Acute Rehabilitation Services Pager [phone] Office [phone]   Jeani HawkingConarpe, [name] 07/06/2018, 4:42 AM

## 2018-07-17 NOTE — Progress Notes (Signed)
eLink Physician-Brief Progress Note Patient Name: Billy AbideKelsey Contreras DOB: 10/06/1970 MRN: [id number]   Date of Service  06/25/2018  HPI/Events of Note  Hypotension, Hyperkalemia, Metabolic acidosis, Thrombocytopenia  eICU Interventions  Discontinue ultrafiltration, 5 % albumin 500 ml iv x 1, sodium bicarbonate 100 meq iv bolus followed by infusion at 60 ml/hr, Kayexalate 45 gm via NG tube x 1, Transfuse 1 unit of platelets, stat echo to evaluate LV function        Mesha Schamberger U Kerby Hockley 07/14/2018, 5:21 AM

## 2018-07-17 NOTE — Progress Notes (Signed)
eLink Physician-Brief Progress Note Patient Name: Billy AbideKelsey Sigl DOB: 04/01/1971 MRN: [id number]   Date of Service  06/27/2018  HPI/Events of Note  Metabolic acidosis with PH of 7.24. CRRT is just getting started.  eICU Interventions  Sodium bicarbonate 50 meq iv bolus x 1        Aslan Himes U Anita Laguna 07/11/2018, 3:57 AM

## 2018-07-17 NOTE — Discharge Summary (Signed)
Discharge Summary    [name] 30-Jul-1970 48 y.o. male  [id number]  Admission Date: July 02, 2018  Discharge Date: 06/28/2018  Physician: Dr. 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 male with history of end-stage renal disease was previously evaluated and transferred to [facility] for complex thoracoabdominal aneurysm 1 week ago.  He now presents via EMS after collapsing at home.  Patient was unresponsive for EMS was intubated.  Time of my exam he is intubated requiring epinephrine for blood pressure.  Family is available in the waiting room.  No history could be obtained from patient.  Past medical history: End-stage renal disease Past surgical history: Aortic arch reconstruction Family history: Noncontributory Social history: Could not be obtained   Hospital Course:  Dr. Randie Heinz discussed with the family that this would be a complex repair (requiring both endovascular stenting of his aorta from the thoracic to the abdominal with bifurcated device standard evar the distal portion and then likely open bypass to the SMA possible celiac arteries) with high risk of intraoperative mortality, blood loss requiring transfusion, spinal cord ischemia, and a low risk to returning to his baseline status.  Family includes wife, mother and close family friends.  Wife tells me that patient would not want this operation and so he will be transition to comfort care.  Pt was taken to comfort care and extubated.  After being extubated, he woke up and was not in pain.  Dr. Myra Gianotti spoke with the patient and family at length regarding the complexity of the situation.  We discussed 3 options.  The first would be transferred to [facility] where a minimally invasive approach could be attempted.  The second would  be palliative care.  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  The patient would like to have his repair done here.  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  The patient understands all of these and wants to have this procedure done today.  The patient was admitted to the hospital and taken to the operating room on Jul 02, 2018 and underwent: 1.  Ultrasound-guided cannulation of right common femoral artery and percutaneous closure 2.  Intravascular ultrasound of right external and common iliac arteries and thoracic and abdominal aorta 3.  Right external iliac artery to SMA bypass with 8 mm PTFE and jump graft to splenic artery with 8 mm PTFE 4.  Ligation of SMA and celiac arteries 5.  Endovascular repair of thoracoabdominal aneurysm with 31 x 31 x 20 cm, 31 x 31 x 20 cm and 31 x 31 x 15 cm Gore TAG 6.  Temporary abdominal closure of abdomen with AB Thera wound VAC 7.  Exposure of right common femoral artery and right lower extremity thromboembolectomy    Findings: There is a large retroperitoneal mass in the left side elevating the left colon anteriorly.  There was a soft area in the right external iliac artery for takeoff of a bypass to the SMA which was also soft however did have significant soft plaque throughout.  Splenic artery was heavily calcified we did find one artery to bypass to in a jump graft configuration from the right external iliac artery to the  SMA.  After stenting from the previous elephant trunk down to the level of the aortic bifurcation we then had flow throughout our stent grafts and filling of her SMA and celiac artery in a retrograde fashion.  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  I completion the patient remained critically ill on 80 mcg/min of Neo-Synephrine and 8 mcg/min of levo fed and was transferred to the ICU intubated.  The pt tolerated the procedure well and was transported to the ICU in critical condition.  He was placed on CVVH that evening.  That evening critical care responded to a call to bedside for worsening metabolic acidosis refractory hypertension. He is on maximum pressor support.  He is on CRRT and a bicarbonate drip with the continued worsening metabolic acidosis. Discussed the case with his wife and family members and they agree with no CPR and no shock at this time.  I have explained he may need to clear himself in the next few hours and and CPR and cardioversion would only calls unnecessary pain without any benefit. Dr.  Darrick Pennaeterding aware of change in CODE STATUS. Note Dr. Pascal LuxKane vascular surgeon is also aware of worsening condition.  With no further surgical interventions available.  Refractory acidosis and refractory hypertension status post repeat AAA repair.  He is been made no shock no CPR with poor prognosis shared with family.  On POD 1, Nephrology and critical care appreciated Transfuse 4 ffp and 2 units prbc's Has been switched to d5 for hypoglycemia I would like to get a baseline neuro eval today if he is stable enough Tentative plan for return to OR tomorrow for abdominal washout and possible closure.  The next morning, Dr. Randie Heinzain was called to bedside earlier this morning for patient worsening status overnight.  On initial evaluation he had increasing pressor requirement with severe thrombocytopenia and acute blood loss anemia.  Plan was for continued platelet transfusion and transfuse 2 units packed red blood cells with second look laparotomy later this morning.  Had had discussed this plan with critical care to increase his pressors to add dopamine given bedside echo that  demonstrated EF in the 20% range.  Unfortunately following my exam patient had what appeared to be PEA arrest with sinus bradycardia on the monitor and no pulses nor blood pressure.  This transition quickly to asystole.  He was a limited code and epinephrine and bicarbonate were administered with no response.  After discussion with the family the code was called and patient expired.    CBC    Component Value Date/Time   WBC 10.2 06-Sep-2017 0528   RBC 2.10 (L) 06-Sep-2017 0528   HGB 6.3 (LL) 06-Sep-2017 0528   HCT 19.4 (L) 06-Sep-2017 0528   PLT 25 (LL) 06-Sep-2017 0528   MCV 92.4 06-Sep-2017 0528   MCH 30.0 06-Sep-2017 0528   MCHC 32.5 06-Sep-2017 0528   RDW 15.9 (H) 06-Sep-2017 0528   LYMPHSABS 1.3 06/15/2018 1825   MONOABS 0.6 06/15/2018 1825   EOSABS 0.1 06/15/2018 1825   BASOSABS 0.1 06/15/2018 1825    BMET    Component Value Date/Time   NA 137 06-Sep-2017 0504   K 6.8 (HH) 06-Sep-2017 0504   CL 107 06-Sep-2017 0504   CO2 11 (L) 06-Sep-2017 0234   GLUCOSE 36 (LL) 06-Sep-2017 0504   BUN 23 (H) 06-Sep-2017 0504   CREATININE 4.50 (  H) 2018-07-02 0504   CALCIUM 7.4 (L) 07-02-2018 0234   CALCIUM 9.3 05/23/2010 1324   GFRNONAA 15 (L) Jul 02, 2018 0234   GFRAA 17 (L) 07/02/2018 0234     Discharge Diagnosis:  Hemorrhagic shock (HCC) [R57.8] Acute respiratory failure, unspecified whether with hypoxia or hypercapnia (HCC) [J96.00] 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 Hemorrhagic shock (HCC) 06/13/2018  . Comfort measures only status 05/27/2018  . ESRD (end stage renal disease) on dialysis (HCC)   . Hypertension   . AAA (abdominal aortic aneurysm, ruptured) [facility])    Past Medical History:  Diagnosis Date  . AAA (abdominal aortic aneurysm, ruptured) (HCC)   . ESRD (end stage renal disease) on dialysis (HCC)   . Hypertension   . Renal disorder      Disposition:  Expired   Doreatha Massed, PA-C [facility] [phone] 06/19/2018  11:36 AM

## 2018-07-17 NOTE — Progress Notes (Addendum)
BS 39. Hypoglycemia protocol initiated. IV 50% dextrose 1/2 amp given. Will recheck in 15 minutes.  Recheck 81

## 2018-07-17 NOTE — Progress Notes (Signed)
`   I was called to bedside earlier this morning for patient worsening status overnight.  On initial evaluation he had increasing pressor requirement with severe thrombocytopenia and acute blood loss anemia.  Plan was for continued platelet transfusion and transfuse 2 units packed red blood cells with second look laparotomy later this morning.  Had had discussed this plan with critical care to increase his pressors to add dopamine given bedside echo that demonstrated EF in the 20% range.  Unfortunately following my exam patient had what appeared to be PEA arrest with sinus bradycardia on the monitor and no pulses nor blood pressure.  This transition quickly to asystole.  He was a limited code and epinephrine and bicarbonate were administered with no response.  After discussion with the family the code was called and patient expired.  Kaity Pitstick C. Randie Heinzain, MD [facility] [place] Office: [phone] Pager: [phone]

## 2018-07-17 DEATH — deceased
